# Patient Record
Sex: Male | Born: 1995 | Hispanic: Yes | Marital: Single | Smoking: Never smoker
Health system: Southern US, Community
[De-identification: ages and names within clinical notes are randomized; demographics above are authoritative.]

## PROBLEM LIST (undated history)

## (undated) DIAGNOSIS — D869 Sarcoidosis, unspecified: Secondary | ICD-10-CM

## (undated) DIAGNOSIS — J309 Allergic rhinitis, unspecified: Secondary | ICD-10-CM

## (undated) DIAGNOSIS — E669 Obesity, unspecified: Secondary | ICD-10-CM

## (undated) DIAGNOSIS — H44119 Panuveitis, unspecified eye: Secondary | ICD-10-CM

## (undated) DIAGNOSIS — J45909 Unspecified asthma, uncomplicated: Secondary | ICD-10-CM

## (undated) HISTORY — PX: ADENOIDECTOMY: SUR15

## (undated) HISTORY — DX: Allergic rhinitis, unspecified: J30.9

## (undated) HISTORY — PX: TONSILLECTOMY: SUR1361

## (undated) HISTORY — DX: Obesity, unspecified: E66.9

---
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
## 2019-05-18 ENCOUNTER — Encounter: Payer: Self-pay | Admitting: *Deleted

## 2019-05-18 ENCOUNTER — Other Ambulatory Visit: Payer: Self-pay

## 2019-05-18 ENCOUNTER — Emergency Department: Payer: 59

## 2019-05-18 ENCOUNTER — Emergency Department
Admission: EM | Admit: 2019-05-18 | Discharge: 2019-05-19 | Disposition: A | Discharge status: Home / Self Care | Payer: 59 | Attending: Emergency Medicine | Admitting: Emergency Medicine

## 2019-05-18 DIAGNOSIS — U071 COVID-19: Secondary | ICD-10-CM | POA: Diagnosis present

## 2019-05-18 DIAGNOSIS — Z79899 Other long term (current) drug therapy: Secondary | ICD-10-CM | POA: Insufficient documentation

## 2019-05-18 DIAGNOSIS — E860 Dehydration: Secondary | ICD-10-CM

## 2019-05-18 DIAGNOSIS — R0602 Shortness of breath: Secondary | ICD-10-CM

## 2019-05-18 DIAGNOSIS — Z7722 Contact with and (suspected) exposure to environmental tobacco smoke (acute) (chronic): Secondary | ICD-10-CM | POA: Insufficient documentation

## 2019-05-18 DIAGNOSIS — J1282 Pneumonia due to coronavirus disease 2019: Secondary | ICD-10-CM | POA: Diagnosis not present

## 2019-05-18 DIAGNOSIS — J45909 Unspecified asthma, uncomplicated: Secondary | ICD-10-CM | POA: Diagnosis not present

## 2019-05-18 LAB — BASIC METABOLIC PANEL
Anion gap: 12 (ref 5–15)
BUN: 15 mg/dL (ref 6–20)
CO2: 21 mmol/L — ABNORMAL LOW (ref 22–32)
Calcium: 8.8 mg/dL — ABNORMAL LOW (ref 8.9–10.3)
Chloride: 104 mmol/L (ref 98–111)
Creatinine, Ser: 0.99 mg/dL (ref 0.61–1.24)
GFR calc Af Amer: >60 mL/min (ref >60)
GFR calc non Af Amer: >60 mL/min (ref >60)
Glucose, Bld: 127 mg/dL — ABNORMAL HIGH (ref 70–99)
Potassium: 3.5 mmol/L (ref 3.5–5.1)
Sodium: 137 mmol/L (ref 135–145)

## 2019-05-18 LAB — CBC
HCT: 48.3 % (ref 39.0–52.0)
Hemoglobin: 16.4 g/dL (ref 13.0–17.0)
MCH: 28.6 pg (ref 26.0–34.0)
MCHC: 34 g/dL (ref 30.0–36.0)
MCV: 84.1 fL (ref 80.0–100.0)
Platelets: 161 10*3/uL (ref 150–400)
RBC: 5.74 MIL/uL (ref 4.22–5.81)
RDW: 12.4 % (ref 11.5–15.5)
WBC: 5.8 10*3/uL (ref 4.0–10.5)
nRBC: 0 % (ref 0.0–0.2)

## 2019-05-18 LAB — TROPONIN I (HIGH SENSITIVITY): Troponin I (High Sensitivity): 3 ng/L (ref <18)

## 2019-05-18 MED ORDER — ACETAMINOPHEN 500 MG PO TABS
1000.0000 mg | ORAL_TABLET | Freq: Once | ORAL | Status: AC
  Administered 2019-05-19: 1000 mg via ORAL
  Filled 2019-05-18: qty 2

## 2019-05-18 MED ORDER — SODIUM CHLORIDE 0.9 % IV BOLUS
1000.0000 mL | Freq: Once | INTRAVENOUS | Status: AC
  Administered 2019-05-19: 1000 mL via INTRAVENOUS

## 2019-05-18 NOTE — ED Triage Notes (Signed)
Pt reports fatigue and SOB x 3 days. Pt tested positive for COVID today. Last dose of tylenol was 20:00 this evening and pt is afebrile upon arrival. Pt is diaphoretic but alert and able to speak in complete sentences and ambulate without increased WOB noted.  No chest pain at this time. Pt reports decreased PO intake and hydration due to increased amount of sleeping.

## 2019-05-19 ENCOUNTER — Encounter: Payer: Self-pay | Admitting: Radiology

## 2019-05-19 ENCOUNTER — Emergency Department: Payer: 59

## 2019-05-19 LAB — PROCALCITONIN: Procalcitonin: <0.1 ng/mL

## 2019-05-19 LAB — TROPONIN I (HIGH SENSITIVITY): Troponin I (High Sensitivity): 2 ng/L (ref <18)

## 2019-05-19 MED ORDER — DEXAMETHASONE SODIUM PHOSPHATE 10 MG/ML IJ SOLN
6.0000 mg | Freq: Once | INTRAMUSCULAR | Status: AC
  Administered 2019-05-19: 6 mg via INTRAVENOUS
  Filled 2019-05-19: qty 1

## 2019-05-19 MED ORDER — IOHEXOL 350 MG/ML SOLN
100.0000 mL | Freq: Once | INTRAVENOUS | Status: AC | PRN
  Administered 2019-05-19: 01:00:00 100 mL via INTRAVENOUS

## 2019-05-19 MED ORDER — SODIUM CHLORIDE 0.9 % IV BOLUS
1000.0000 mL | Freq: Once | INTRAVENOUS | Status: AC
  Administered 2019-05-19: 02:00:00 1000 mL via INTRAVENOUS

## 2019-05-19 MED ORDER — ONDANSETRON 4 MG PO TBDP: 4.0000 mg | ORAL_TABLET | Freq: Three times a day (TID) | ORAL | 0 refills | Status: DC | PRN

## 2019-05-19 MED ORDER — DEXAMETHASONE 6 MG PO TABS: 6.0000 mg | ORAL_TABLET | Freq: Two times a day (BID) | ORAL | 0 refills | Status: DC

## 2019-05-19 NOTE — ED Notes (Signed)
Pt to CT via stretcher accomp by CT tech 

## 2019-05-19 NOTE — ED Notes (Signed)
Pt returns to room from CT 

## 2019-05-19 NOTE — ED Notes (Signed)
Pt ambulatory without difficulty; pulse ox on ra maintains 95-100% with ambulation and pt voices no c/o with effort

## 2019-05-19 NOTE — Discharge Instructions (Addendum)

## 2019-05-19 NOTE — ED Notes (Signed)
Pt uprite on stretcher in exam room with no distress noted; reports recent fever & cough with SHOB; st +COVID at [facility] on Wednesday; pt assisted into hosp gown & on card monitor

## 2019-05-19 NOTE — ED Provider Notes (Signed)
[facility] Emergency Department Provider Note  ____________________________________________  Time seen: Approximately 1:22 AM  I have reviewed the triage vital signs and the nursing notes.   HISTORY  Chief Complaint Tachycardia and COVID   HPI Roy Rowe is a 24 y.o. male with a history of asthma and obesity who presents for evaluation of tachycardia.  [facility] after having 3 days of fever as high as 101F, cough productive of yellow phlegm, generalized body aches, and fatigue.  He was diagnosed with Covid and found to be tachycardic with a pulse in the 140s.  Therefore he was sent to the emergency room for evaluation.  Patient reports intermittent episodes of sharp chest pain when he coughs.  Also complaining of mild shortness of breath mostly when he sits up.  He denies any chest pain or SOB at this time.  Has had nausea but no vomiting or diarrhea.   Has had decreased oral hydration.  Has been sleeping a lot.  He denies any personal or family history of blood clots, recent travel immobilization, leg pain or swelling, hemoptysis, or exogenous hormones.  Past Medical History:  Diagnosis Date  . Allergic rhinitis   . Asthma   . Obesity (BMI 30-39.9)     Patient Active Problem List   Diagnosis Date Noted  . Blepharitis of left eye 03/24/2013  . Hordeolum externum 03/22/2013  . Obesity, unspecified 02/01/2013  . Allergic rhinitis 02/01/2013    Past Surgical History:  Procedure Laterality Date  . ADENOIDECTOMY    . TONSILLECTOMY      Prior to Admission medications   Medication Sig Start Date End Date Taking? Authorizing Provider  amoxicillin (AMOXIL) 500 MG capsule Take 1 capsule (500 mg total) by mouth 2 (two) times daily. 02/08/13   Lura Em, MD  cetirizine (ZYRTEC) 10 MG tablet Take 1 tablet (10 mg total) by mouth daily. 02/01/13   Lura Em, MD  clindamycin (CLEOCIN) 300 MG capsule Take 2 capsules (600 mg total)  by mouth 3 (three) times daily. 03/22/13   Tesfaye, Mekdem, MD  dexamethasone (DECADRON) 6 MG tablet Take 1 tablet (6 mg total) by mouth 2 (two) times daily with a meal for 5 days. 05/19/19 05/24/19  Nita Sickle, MD  HYDROcodone-acetaminophen (NORCO/VICODIN) 5-325 MG tablet Take one tab po q 4-6 hrs prn pain 10/24/15   Triplett, Tammy, PA-C  ibuprofen (ADVIL,MOTRIN) 600 MG tablet Take 1 tablet (600 mg total) by mouth every 6 (six) hours as needed. Take with food 10/24/15   Triplett, Tammy, PA-C  ondansetron (ZOFRAN ODT) 4 MG disintegrating tablet Take 1 tablet (4 mg total) by mouth every 8 (eight) hours as needed. 05/19/19   Nita Sickle, MD    Allergies Patient has no known allergies.  Family History  Problem Relation Age of Onset  . Allergic rhinitis Brother   . Asthma Brother     Social History Social History   Tobacco Use  . Smoking status: Passive Smoke Exposure - Never Smoker  . Smokeless tobacco: Never Used  Substance Use Topics  . Alcohol use: No  . Drug use: No    Review of Systems  Constitutional: + fever, body aches, fatigue Eyes: Negative for visual changes. ENT: Negative for sore throat. Neck: No neck pain  Cardiovascular: + intermittent chest pain. Respiratory: + shortness of breath, cough Gastrointestinal: Negative for abdominal pain, vomiting or diarrhea. + nausea Genitourinary: Negative for dysuria. Musculoskeletal: Negative for back pain. Skin: Negative for  rash. Neurological: Negative for headaches, weakness or numbness. Psych: No SI or HI  ____________________________________________   PHYSICAL EXAM:  VITAL SIGNS: ED Triage Vitals [05/18/19 2200]  Enc Vitals Group     BP (!) 149/107     Pulse Rate (!) 124     Resp 20     Temp 99.7 F (37.6 C)     Temp Source Oral     SpO2 95 %     Weight 229 lb 15 oz (104.3 kg)     Height      Head Circumference      Peak Flow      Pain Score 5     Pain Loc      Pain Edu?      Excl. in [place]?      Constitutional: Alert and oriented. Well appearing and in no apparent distress. HEENT:      Head: Normocephalic and atraumatic.         Eyes: Conjunctivae are normal. Sclera is non-icteric.       Mouth/Throat: Mucous membranes are dry.       Neck: Supple with no signs of meningismus. Cardiovascular: Tachycardic with regular rhythm. No murmurs, gallops, or rubs. 2+ symmetrical distal pulses are present in all extremities. No JVD. Respiratory: Normal respiratory effort. Lungs are clear to auscultation bilaterally. No wheezes, crackles, or rhonchi.  Gastrointestinal: Soft, non tender, and non distended with positive bowel sounds. No rebound or guarding. Musculoskeletal: Nontender with normal range of motion in all extremities. No edema, cyanosis, or erythema of extremities. Neurologic: Normal speech and language. Face is symmetric. Moving all extremities. No gross focal neurologic deficits are appreciated. Skin: Skin is warm, dry and intact. No rash noted. Psychiatric: Mood and affect are normal. Speech and behavior are normal.  ____________________________________________   LABS (all labs ordered are listed, but only abnormal results are displayed)  Labs Reviewed  BASIC METABOLIC PANEL - Abnormal; Notable for the following components:      Result Value   CO2 21 (*)    Glucose, Bld 127 (*)    Calcium 8.8 (*)    All other components within normal limits  CBC  PROCALCITONIN  TROPONIN I (HIGH SENSITIVITY)  TROPONIN I (HIGH SENSITIVITY)   ____________________________________________  EKG  ED ECG REPORT I, Rudene Re, the attending physician, personally viewed and interpreted this ECG.  Sinus tachycardia, rate of 125, normal intervals, right axis deviation, no ST elevations or depressions.  No prior for comparison. ____________________________________________  RADIOLOGY  I have personally reviewed the images performed during this visit and I agree with the Radiologist's  read.   Interpretation by Radiologist:  DG Chest 1 View  Result Date: 05/19/2019 CLINICAL DATA:  24 year old male with shortness of breath and fatigue. EXAM: CHEST  1 VIEW COMPARISON:  Chest radiograph dated 09/22/2007 FINDINGS: There is borderline cardiomegaly. Left upper lobe and left lung base streaky densities may represent vascular congestion but concerning for developing infiltrate. Clinical correlation is recommended. There is no pleural effusion or pneumothorax. No acute osseous pathology. IMPRESSION: Vascular congestion versus developing infiltrate in the left lung. Clinical correlation is recommended. Electronically Signed   By: Anner Crete M.D.   On: 05/19/2019 00:20   CT Angio Chest PE W and/or Wo Contrast  Result Date: 05/19/2019 CLINICAL DATA:  24 year old male with shortness of breath x3 days. Concern for pulmonary embolism. EXAM: CT ANGIOGRAPHY CHEST WITH CONTRAST TECHNIQUE: Multidetector CT imaging of the chest was performed using the standard protocol during  bolus administration of intravenous contrast. Multiplanar CT image reconstructions and MIPs were obtained to evaluate the vascular anatomy. CONTRAST:  OMNIPAQUE IOHEXOL 350 MG/ML SOLN COMPARISON:  Chest radiograph dated 05/19/2019. FINDINGS: Cardiovascular: There is no cardiomegaly or pericardial effusion. The thoracic aorta is unremarkable. Evaluation of the pulmonary arteries is very limited due to respiratory motion artifact and suboptimal opacification and timing of the contrast. No large central pulmonary artery embolus identified. Mediastinum/Nodes: No hilar or mediastinal adenopathy. The esophagus and thyroid gland are grossly unremarkable as visualized. No mediastinal fluid collection. Lungs/Pleura: Multiple bilateral confluent and nodular airspace opacities with the largest measuring approximately 3.5 cm in the left upper lobe most consistent with multifocal pneumonia. Septic emboli are less likely but not excluded.  Clinical correlation is recommended. There is no pleural effusion or pneumothorax. The central airways are patent. Upper Abdomen: Fatty infiltration of the liver. Musculoskeletal: Mild dextroscoliosis of the thoracic spine. No acute osseous pathology. Review of the MIP images confirms the above findings. IMPRESSION: 1. No CT evidence of central pulmonary artery embolus. 2. Bilateral confluent and nodular airspace opacities most consistent with multifocal pneumonia. Septic emboli are less likely but not excluded. Clinical correlation is recommended. Electronically Signed   By: Elgie Collard M.D.   On: 05/19/2019 01:07     ____________________________________________   PROCEDURES  Procedure(s) performed: None Procedures Critical Care performed:  None ____________________________________________   INITIAL IMPRESSION / ASSESSMENT AND PLAN / ED COURSE   24 y.o. male with a history of asthma and obesity who presents for evaluation of [facility]cardia from [facility] after being diagnosed with COVID today.  Patient looks dry on exam, tachycardic with a pulse of 124, afebrile, normal work of breathing, normal sats both at rest and with ambulation, lungs are clear to auscultation with great air movement, no wheezing or crackles.  Labs with no electrolyte derangements, AKI, significant dehydration.  No leukocytosis, no anemia.  EKG showing sinus tachycardia with no ischemic changes.  Troponin x2 - showing no cardiac involvement.  CT of the chest was done due to significant tachycardia, chest pain and shortness of breath to rule out a PE.  CT shows findings consistent with Covid but no signs of PE.  Since patient has a history of asthma he was given IV Decadron.  He is not having an asthma exacerbation at this time. Will give IVF and reassess.   Clinical Course as of May 18 249  Thu May 19, 2019  2956 Patient remains extremely well-appearing with normal work of breathing.  I ambulated him in the room and his  sats were never below 98%.  His heart rate has improved significantly with fluids.  Will discharge home with Decadron, Zofran, increase p.o. hydration.  Recommended pulse oximeter checks at home, discussed follow-up with PCP and my standard return precautions.   [CV]    Clinical Course User Index [CV] Don Perking [place], MD      As part of my medical decision making, I reviewed the following data within the electronic MEDICAL RECORD NUMBER Nursing notes reviewed and incorporated, Labs reviewed , EKG interpreted , Old EKG reviewed, Old chart reviewed, Radiograph reviewed , Notes from prior ED visits and [place] Controlled Substance Database   Please note:  Patient was evaluated in Emergency Department today for the symptoms described in the history of present illness. Patient was evaluated in the context of the global COVID-19 pandemic, which necessitated consideration that the patient might be at risk for infection with the SARS-CoV-2 virus that causes  COVID-19. Institutional protocols and algorithms that pertain to the evaluation of patients at risk for COVID-19 are in a state of rapid change based on information released by regulatory bodies including the CDC and federal and state organizations. These policies and algorithms were followed during the patient's care in the ED.  Some ED evaluations and interventions may be delayed as a result of limited staffing during the pandemic.   ____________________________________________   FINAL CLINICAL IMPRESSION(S) / ED DIAGNOSES   Final diagnoses:  Pneumonia due to COVID-19 virus  Dehydration      NEW MEDICATIONS STARTED DURING THIS VISIT:  ED Discharge Orders         Ordered    dexamethasone (DECADRON) 6 MG tablet  2 times daily with meals     05/19/19 0134    ondansetron (ZOFRAN ODT) 4 MG disintegrating tablet  Every 8 hours PRN     05/19/19 0134           Note:  This document was prepared using Dragon voice recognition software and may  include unintentional dictation errors.    Don Perking, [place], MD 05/19/19 [phone]

## 2019-05-23 ENCOUNTER — Emergency Department: Payer: 59

## 2019-05-23 ENCOUNTER — Encounter: Payer: Self-pay | Admitting: Emergency Medicine

## 2019-05-23 ENCOUNTER — Inpatient Hospital Stay
Admission: EM | Admit: 2019-05-23 | Discharge: 2019-05-27 | DRG: 177 | Disposition: A | Discharge status: Home / Self Care | Payer: 59 | Attending: Internal Medicine | Admitting: Internal Medicine

## 2019-05-23 ENCOUNTER — Other Ambulatory Visit: Payer: Self-pay

## 2019-05-23 DIAGNOSIS — Z23 Encounter for immunization: Secondary | ICD-10-CM | POA: Diagnosis not present

## 2019-05-23 DIAGNOSIS — R030 Elevated blood-pressure reading, without diagnosis of hypertension: Secondary | ICD-10-CM | POA: Diagnosis present

## 2019-05-23 DIAGNOSIS — J45909 Unspecified asthma, uncomplicated: Secondary | ICD-10-CM | POA: Diagnosis present

## 2019-05-23 DIAGNOSIS — Z6841 Body Mass Index (BMI) 40.0 and over, adult: Secondary | ICD-10-CM

## 2019-05-23 DIAGNOSIS — J9601 Acute respiratory failure with hypoxia: Secondary | ICD-10-CM | POA: Diagnosis present

## 2019-05-23 DIAGNOSIS — K760 Fatty (change of) liver, not elsewhere classified: Secondary | ICD-10-CM | POA: Diagnosis present

## 2019-05-23 DIAGNOSIS — U071 COVID-19: Principal | ICD-10-CM | POA: Diagnosis present

## 2019-05-23 DIAGNOSIS — J1282 Pneumonia due to coronavirus disease 2019: Secondary | ICD-10-CM | POA: Diagnosis not present

## 2019-05-23 DIAGNOSIS — R0902 Hypoxemia: Secondary | ICD-10-CM | POA: Diagnosis present

## 2019-05-23 DIAGNOSIS — R7989 Other specified abnormal findings of blood chemistry: Secondary | ICD-10-CM

## 2019-05-23 LAB — COMPREHENSIVE METABOLIC PANEL
ALT: 260 U/L — ABNORMAL HIGH (ref 0–44)
AST: 194 U/L — ABNORMAL HIGH (ref 15–41)
Albumin: 3.7 g/dL (ref 3.5–5.0)
Alkaline Phosphatase: 65 U/L (ref 38–126)
Anion gap: 14 (ref 5–15)
BUN: 15 mg/dL (ref 6–20)
CO2: 21 mmol/L — ABNORMAL LOW (ref 22–32)
Calcium: 8.4 mg/dL — ABNORMAL LOW (ref 8.9–10.3)
Chloride: 97 mmol/L — ABNORMAL LOW (ref 98–111)
Creatinine, Ser: 0.91 mg/dL (ref 0.61–1.24)
GFR calc Af Amer: >60 mL/min (ref >60)
GFR calc non Af Amer: >60 mL/min (ref >60)
Glucose, Bld: 135 mg/dL — ABNORMAL HIGH (ref 70–99)
Potassium: 3.2 mmol/L — ABNORMAL LOW (ref 3.5–5.1)
Sodium: 132 mmol/L — ABNORMAL LOW (ref 135–145)
Total Bilirubin: 2.1 mg/dL — ABNORMAL HIGH (ref 0.3–1.2)
Total Protein: 8.3 g/dL — ABNORMAL HIGH (ref 6.5–8.1)

## 2019-05-23 LAB — LACTIC ACID, PLASMA: Lactic Acid, Venous: 1.3 mmol/L (ref 0.5–1.9)

## 2019-05-23 LAB — CBC WITH DIFFERENTIAL/PLATELET
Abs Immature Granulocytes: 0.06 10*3/uL (ref 0.00–0.07)
Basophils Absolute: 0 10*3/uL (ref 0.0–0.1)
Basophils Relative: 0 %
Eosinophils Absolute: 0 10*3/uL (ref 0.0–0.5)
Eosinophils Relative: 0 %
HCT: 46.4 % (ref 39.0–52.0)
Hemoglobin: 16.2 g/dL (ref 13.0–17.0)
Immature Granulocytes: 1 %
Lymphocytes Relative: 11 %
Lymphs Abs: 1.2 10*3/uL (ref 0.7–4.0)
MCH: 28.8 pg (ref 26.0–34.0)
MCHC: 34.9 g/dL (ref 30.0–36.0)
MCV: 82.4 fL (ref 80.0–100.0)
Monocytes Absolute: 0.5 10*3/uL (ref 0.1–1.0)
Monocytes Relative: 4 %
Neutro Abs: 8.9 10*3/uL — ABNORMAL HIGH (ref 1.7–7.7)
Neutrophils Relative %: 84 %
Platelets: 243 10*3/uL (ref 150–400)
RBC: 5.63 MIL/uL (ref 4.22–5.81)
RDW: 12 % (ref 11.5–15.5)
WBC: 10.7 10*3/uL — ABNORMAL HIGH (ref 4.0–10.5)
nRBC: 0 % (ref 0.0–0.2)

## 2019-05-23 LAB — LACTATE DEHYDROGENASE: LDH: 428 U/L — ABNORMAL HIGH (ref 98–192)

## 2019-05-23 LAB — FIBRIN DERIVATIVES D-DIMER (ARMC ONLY): Fibrin derivatives D-dimer (ARMC): 520.42 ng/mL (FEU) — ABNORMAL HIGH (ref 0.00–499.00)

## 2019-05-23 LAB — PROCALCITONIN: Procalcitonin: <0.1 ng/mL

## 2019-05-23 LAB — POC SARS CORONAVIRUS 2 AG: SARS Coronavirus 2 Ag: POSITIVE — AB

## 2019-05-23 LAB — HIV ANTIBODY (ROUTINE TESTING W REFLEX): HIV Screen 4th Generation wRfx: NONREACTIVE

## 2019-05-23 LAB — FERRITIN: Ferritin: 801 ng/mL — ABNORMAL HIGH (ref 24–336)

## 2019-05-23 LAB — C-REACTIVE PROTEIN: CRP: 8.9 mg/dL — ABNORMAL HIGH (ref <1.0)

## 2019-05-23 LAB — TROPONIN I (HIGH SENSITIVITY): Troponin I (High Sensitivity): 3 ng/L (ref <18)

## 2019-05-23 MED ORDER — SODIUM CHLORIDE 0.9 % IV SOLN
200.0000 mg | Freq: Once | INTRAVENOUS | Status: AC
  Administered 2019-05-23: 200 mg via INTRAVENOUS
  Filled 2019-05-23: qty 200

## 2019-05-23 MED ORDER — ONDANSETRON HCL 4 MG/2ML IJ SOLN: 4.0000 mg | Freq: Four times a day (QID) | INTRAMUSCULAR | Status: DC | PRN

## 2019-05-23 MED ORDER — SODIUM CHLORIDE 0.9 % IV BOLUS
500.0000 mL | Freq: Once | INTRAVENOUS | Status: AC
  Administered 2019-05-23: 500 mL via INTRAVENOUS

## 2019-05-23 MED ORDER — ACETAMINOPHEN 325 MG PO TABS
650.0000 mg | ORAL_TABLET | Freq: Four times a day (QID) | ORAL | Status: DC | PRN
  Administered 2019-05-23: 650 mg via ORAL
  Filled 2019-05-23: qty 2

## 2019-05-23 MED ORDER — ENOXAPARIN SODIUM 40 MG/0.4ML ~~LOC~~ SOLN
40.0000 mg | Freq: Two times a day (BID) | SUBCUTANEOUS | Status: DC
  Administered 2019-05-23 – 2019-05-27 (×8): 40 mg via SUBCUTANEOUS
  Filled 2019-05-23 (×8): qty 0.4

## 2019-05-23 MED ORDER — DEXAMETHASONE SODIUM PHOSPHATE 10 MG/ML IJ SOLN
6.0000 mg | INTRAMUSCULAR | Status: DC
  Administered 2019-05-24 – 2019-05-27 (×4): 6 mg via INTRAVENOUS
  Filled 2019-05-23 (×5): qty 1

## 2019-05-23 MED ORDER — SODIUM CHLORIDE 0.9 % IV SOLN
100.0000 mg | Freq: Every day | INTRAVENOUS | Status: AC
  Administered 2019-05-24 – 2019-05-27 (×4): 100 mg via INTRAVENOUS
  Filled 2019-05-23 (×5): qty 20

## 2019-05-23 MED ORDER — ONDANSETRON HCL 4 MG PO TABS: 4.0000 mg | ORAL_TABLET | Freq: Four times a day (QID) | ORAL | Status: DC | PRN

## 2019-05-23 MED ORDER — DEXAMETHASONE SODIUM PHOSPHATE 10 MG/ML IJ SOLN
10.0000 mg | Freq: Once | INTRAMUSCULAR | Status: AC
  Administered 2019-05-23: 10 mg via INTRAVENOUS
  Filled 2019-05-23: qty 1

## 2019-05-23 NOTE — ED Notes (Signed)
Plugged up pt's cell phone for him and adjusted bed.

## 2019-05-23 NOTE — H&P (Addendum)
History and Physical:    Roy Rowe   GNF:[id number] DOB: [birth year] DOA: 05/23/2019  Referring MD/provider: Dr. Daryel November PCP: Patient, [name]   Patient coming from: Home  Chief Complaint: Shortness of breath  History of Present Illness:   Roy Rowe is an 24 y.o. male with medical history significant for morbid obesity, asthma, allergic rhinitis, presented to the hospital because of shortness of breath.  He said he [facility], [facility].  He has been feeling weak, tired and feverish at home.  His temperature has been above 101 at times.  Shortness of breath is usually worse with exertion.  He says shortness of breath worsened today (in the early hours of the morning) so he decided to come to the emergency room for further evaluation.  He has a cough but  no vomiting, diarrhea, abdominal pain.  ED Course:  The patient was hypertensive, tachypneic, tachycardic and hypoxemic in the emergency room but he was afebrile.  Oxygen saturation was 89% on room air. He was placed on 2 L of oxygen via nasal cannula.  Ferritin was 801, LDH 428 and CRP 8.9.  Chest x-ray showed bilateral opacities.  He was given IV dexamethasone in the emergency room.  ROS:   ROS all other systems reviewed were negative  Past Medical History:   Past Medical History:  Diagnosis Date  . Allergic rhinitis   . Asthma   . Obesity (BMI 30-39.9)     Past Surgical History:   Past Surgical History:  Procedure Laterality Date  . ADENOIDECTOMY    . TONSILLECTOMY      Social History:   Social History   Socioeconomic History  . Marital status: Single    Spouse name: Not on file  . Number of children: Not on file  . Years of education: Not on file  . Highest education level: Not on file  Occupational History  . Not on file  Tobacco Use  . Smoking status: Passive Smoke Exposure - Never Smoker  . Smokeless tobacco: Never Used  Substance  and Sexual Activity  . Alcohol use: No  . Drug use: No  . Sexual activity: Never  Other Topics Concern  . Not on file  Social History Narrative  . Not on file   Social Determinants of Health   Financial Resource Strain:   . Difficulty of Paying Living Expenses: Not on file  Food Insecurity:   . Worried About Programme researcher, broadcasting/film/video in the Last Year: Not on file  . Ran Out of Food in the Last Year: Not on file  Transportation Needs:   . Lack of Transportation (Medical): Not on file  . Lack of Transportation (Non-Medical): Not on file  Physical Activity:   . Days of Exercise per Week: Not on file  . Minutes of Exercise per Session: Not on file  Stress:   . Feeling of Stress : Not on file  Social Connections:   . Frequency of Communication with Friends and Family: Not on file  . Frequency of Social Gatherings with Friends and Family: Not on file  . Attends Religious Services: Not on file  . Active Member of Clubs or Organizations: Not on file  . Attends Banker Meetings: Not on file  . Marital Status: Not on file  Intimate Partner Violence:   . Fear of Current or Ex-Partner: Not on file  . Emotionally Abused: Not  on file  . Physically Abused: Not on file  . Sexually Abused: Not on file    Allergies   Patient has no known allergies.  Family history:   Family History  Problem Relation Age of Onset  . Allergic rhinitis Brother   . Asthma Brother     Current Medications:   Prior to Admission medications   Medication Sig Start Date End Date Taking? Authorizing Provider  dexamethasone (DECADRON) 6 MG tablet Take 1 tablet (6 mg total) by mouth 2 (two) times daily with a meal for 5 days. 05/19/19 05/24/19  Rudene Re, MD  ondansetron (ZOFRAN ODT) 4 MG disintegrating tablet Take 1 tablet (4 mg total) by mouth every 8 (eight) hours as needed. 05/19/19   Rudene Re, MD    Physical Exam:   Vitals:   05/23/19 1130 05/23/19 1200 05/23/19 1300 05/23/19  1330  BP: (!) 167/96 (!) 173/94 (!) 153/72 (!) 169/91  Pulse: 99 (!) 110 (!) 103 (!) 103  Resp: (!) 32 (!) 30 (!) 26 (!) 28  Temp:      TempSrc:      SpO2: 96% 94% 94% 94%  Weight:      Height:         Physical Exam: Blood pressure (!) 169/91, pulse (!) 103, temperature 98.8 F (37.1 C), temperature source Oral, resp. rate (!) 28, height 6\' 2"  (1.88 m), weight (!) 167.8 kg, SpO2 94 %. Gen: No acute distress. Head: Normocephalic, atraumatic. Eyes: Pupils equal, round and reactive to light. Extraocular movements intact.  Sclerae nonicteric.  Mouth: Moist mucous membranes Neck: Supple, no thyromegaly, no lymphadenopathy, no jugular venous distention. Chest: Lungs are clear to auscultation with good air movement. No rales, rhonchi or wheezes.  CV: Heart sounds are regular with an S1, S2. No murmurs, rubs or gallops.  Abdomen: Soft, nontender, obese with normal active bowel sounds. No palpable masses. Extremities: Extremities are without clubbing, or cyanosis. No edema. Pedal pulses 2+.  Skin: Warm and dry. No rash Neuro: Alert and oriented times 3; grossly nonfocal.  Psych: Insight is good and judgment is appropriate. Mood and affect normal.   Data Review:    Labs: Basic Metabolic Panel: Recent Labs  Lab 05/18/19 2205 05/23/19 0730  NA 137 132*  K 3.5 3.2*  CL 104 97*  CO2 21* 21*  GLUCOSE 127* 135*  BUN 15 15  CREATININE 0.99 0.91  CALCIUM 8.8* 8.4*   Liver Function Tests: Recent Labs  Lab 05/23/19 0730  AST 194*  ALT 260*  ALKPHOS 65  BILITOT 2.1*  PROT 8.3*  ALBUMIN 3.7   No results for input(s): LIPASE, AMYLASE in the last 168 hours. No results for input(s): AMMONIA in the last 168 hours. CBC: Recent Labs  Lab 05/18/19 2205 05/23/19 0730  WBC 5.8 10.7*  NEUTROABS  --  8.9*  HGB 16.4 16.2  HCT 48.3 46.4  MCV 84.1 82.4  PLT 161 243   Cardiac Enzymes: No results for input(s): CKTOTAL, CKMB, CKMBINDEX, TROPONINI in the last 168 hours.  BNP (last  3 results) No results for input(s): PROBNP in the last 8760 hours. CBG: No results for input(s): GLUCAP in the last 168 hours.  Urinalysis No results found for: COLORURINE, APPEARANCEUR, LABSPEC, PHURINE, GLUCOSEU, HGBUR, BILIRUBINUR, KETONESUR, PROTEINUR, UROBILINOGEN, NITRITE, LEUKOCYTESUR    Radiographic Studies: DG Chest Port 1 View  Result Date: 05/23/2019 CLINICAL DATA:  Shortness of breath.  COVID-19 positive EXAM: PORTABLE CHEST 1 VIEW COMPARISON:  May 18, 2019 chest radiograph  and chest CT May 19, 2019 FINDINGS: There is airspace opacity in both mid and lower lung zones with an overall slight increase in opacity compared to recent study. No frank consolidation. Heart is upper normal in size with pulmonary vascularity normal. No adenopathy. No bone lesions. IMPRESSION: Patchy airspace opacity bilaterally, with an overall slight increase in opacity. The area of somewhat irregular opacity in the left upper lobe seen on recent CT is not appreciable by radiography; there may be focal clearing in this area of the left upper lobe compared to previous study. Stable cardiac silhouette. No adenopathy. The overall appearance is consistent with atypical organism pneumonia. Electronically Signed   By: Bretta Bang III M.D.   On: 05/23/2019 07:59    EKG: Independently reviewed.  Sinus tachycardia   Assessment/Plan:   Principal Problem:   Pneumonia due to COVID-19 virus Active Problems:   Morbid obesity (HCC)   Acute hypoxemic respiratory failure (HCC)   Body mass index is 47.51 kg/m.  (Morbid obesity)  COVID-19 pneumonia: Admit to MedSurg.  Treat with IV dexamethasone and remdesivir.  I informed his nurse, Tinnie Gens, to obtain coronavirus test result from [facility].  Acute hypoxemic respiratory failure: He is on 2 L/min oxygen via nasal cannula.  Taper off as able.  Elevated blood pressure: Not a known hypertensive.  Monitor BP.  Asthma: Compensated.  Other  information:   DVT prophylaxis: Lovenox Code Status: Full code. Family Communication: Plan discussed with the patient Disposition Plan: Possible discharge to home in 2 to 3 days depending on clinical condition Consults called: None Admission status: Inpatient   The medical decision making is of moderate complexity, therefore this is a level 2 visit.  Time spent 45 minutes  Geovani Tootle [facility]   How to contact the [facility] Attending or Consulting provider 7A - 7P or covering provider during after hours 7P -7A, for this patient?   1. Check the care team in [facility] and look for a) attending/consulting [facility] provider listed and b) the [facility] team listed 2. Log into [email or web address] and use [place]'s universal password to access. If you do not have the password, please contact the hospital operator. 3. Locate the [facility] provider you are looking for under [facility] and page to a number that you can be directly reached. 4. If you still have difficulty reaching the provider, please page the [facility] (Director on Call) for the Hospitalists listed on amion for assistance.  05/23/2019, 2:29 PM

## 2019-05-23 NOTE — ED Notes (Signed)
Spoke w/ pt's mother Gilman Buttner [phone] and provided an update on her son.

## 2019-05-23 NOTE — Progress Notes (Signed)
PHARMACIST - PHYSICIAN COMMUNICATION  CONCERNING:  Enoxaparin (Lovenox) for DVT Prophylaxis    RECOMMENDATION: Patient was prescribed enoxaprin 40mg  q24 hours for VTE prophylaxis.   Filed Weights   05/23/19 0722  Weight: (!) 370 lb (167.8 kg)    Body mass index is 47.51 kg/m.  Estimated Creatinine Clearance: 211.4 mL/min (by C-G formula based on SCr of 0.91 mg/dL).   Based on [facility] policy patient is candidate for enoxaparin 40mg  every 12 hour dosing due to BMI being >40.   DESCRIPTION: Pharmacy has adjusted enoxaparin dose per [facility] policy.  Patient is now receiving enoxaparin 40mg  every 12 hours.    Timithy Arons, PharmD Clinical Pharmacist  05/23/2019 12:01 PM

## 2019-05-23 NOTE — ED Notes (Signed)
Replaced ekg leads on pt to get him back on the monitor, provided pt w/ meal tray.

## 2019-05-23 NOTE — ED Notes (Signed)
Provided pt with tv remote.

## 2019-05-23 NOTE — ED Notes (Signed)
Assited pt to use the toilet.

## 2019-05-23 NOTE — ED Notes (Signed)
. ED TO INPATIENT HANDOFF REPORT  ED Nurse Name and Phone #: Danae Orleans Name/Age/Gender Roy Rowe 24 y.o. male Room/Bed: ED32A/ED32A  Code Status   Code Status: Full Code  Home/SNF/Other Home Patient oriented to: self, place, time and situation Is this baseline? Yes   Triage Complete: Triage complete  Chief Complaint Pneumonia due to COVID-19 virus [U07.1, J12.82]  Triage Note Pt in via [facility], pt is Covid+, reports acute SOB waking him from his sleep. Per EMS, pt hypoxic, 89% on room air.   Room air sat 91% upon arrival.  Increased work of breathing noted with any exertion.  Pt placed on 2L nasal cannula.  EDP, Williams at bedside.    Allergies No Known Allergies  Level of Care/Admitting Diagnosis ED Disposition    ED Disposition Condition Comment   Admit  Hospital Area: [facility] [[id number]]  Level of Care: Med-Surg [16]  Covid Evaluation: Confirmed COVID Positive  Diagnosis: Pneumonia due to COVID-19 virus [[id number]]  Admitting Physician: Conard Novak  Attending Physician: Conard Novak  Estimated length of stay: 3 - 4 days  Certification:: I certify this patient will need inpatient services for at least 2 midnights       B Medical/Surgery History Past Medical History:  Diagnosis Date  . Allergic rhinitis   . Asthma   . Obesity (BMI 30-39.9)    Past Surgical History:  Procedure Laterality Date  . ADENOIDECTOMY    . TONSILLECTOMY       A IV Location/Drains/Wounds Patient Lines/Drains/Airways Status   Active Line/Drains/Airways    Name:   Placement date:   Placement time:   Site:   Days:   Peripheral IV 05/23/19 Right Antecubital   05/23/19    0730    Antecubital   less than 1          Intake/Output Last 24 hours  Intake/Output Summary (Last 24 hours) at 05/23/2019 2237 Last data filed at 05/23/2019 1444 Gross per 24 hour  Intake 750 ml  Output --  Net 750 ml    Labs/Imaging Results for  orders placed or performed during the hospital encounter of 05/23/19 (from the past 48 hour(s))  Lactic acid, plasma     Status: None   Collection Time: 05/23/19  7:30 AM  Result Value Ref Range   Lactic Acid, Venous 1.3 0.5 - 1.9 mmol/L    Comment: Performed at [facility], [address] [place], [place] [address]  CBC WITH DIFFERENTIAL     Status: Abnormal   Collection Time: 05/23/19  7:30 AM  Result Value Ref Range   WBC 10.7 (H) 4.0 - 10.5 K/uL   RBC 5.63 4.22 - 5.81 MIL/uL   Hemoglobin 16.2 13.0 - 17.0 g/dL   HCT 01.6 01.0 - 93.2 %   MCV 82.4 80.0 - 100.0 fL   MCH 28.8 26.0 - 34.0 pg   MCHC 34.9 30.0 - 36.0 g/dL   RDW 35.5 73.2 - 20.2 %   Platelets 243 150 - 400 K/uL   nRBC 0.0 0.0 - 0.2 %   Neutrophils Relative % 84 %   Neutro Abs 8.9 (H) 1.7 - 7.7 K/uL   Lymphocytes Relative 11 %   Lymphs Abs 1.2 0.7 - 4.0 K/uL   Monocytes Relative 4 %   Monocytes Absolute 0.5 0.1 - 1.0 K/uL   Eosinophils Relative 0 %   Eosinophils Absolute 0.0 0.0 - 0.5 K/uL   Basophils Relative 0 %  Basophils Absolute 0.0 0.0 - 0.1 K/uL   Immature Granulocytes 1 %   Abs Immature Granulocytes 0.06 0.00 - 0.07 K/uL    Comment: Performed at [facility], [address] [place], [place] [address]  Comprehensive metabolic panel     Status: Abnormal   Collection Time: 05/23/19  7:30 AM  Result Value Ref Range   Sodium 132 (L) 135 - 145 mmol/L   Potassium 3.2 (L) 3.5 - 5.1 mmol/L   Chloride 97 (L) 98 - 111 mmol/L   CO2 21 (L) 22 - 32 mmol/L   Glucose, Bld 135 (H) 70 - 99 mg/dL   BUN 15 6 - 20 mg/dL   Creatinine, Ser 0.91 0.61 - 1.24 mg/dL   Calcium 8.4 (L) 8.9 - 10.3 mg/dL   Total Protein 8.3 (H) 6.5 - 8.1 g/dL   Albumin 3.7 3.5 - 5.0 g/dL   AST 194 (H) 15 - 41 U/L   ALT 260 (H) 0 - 44 U/L   Alkaline Phosphatase 65 38 - 126 U/L   Total Bilirubin 2.1 (H) 0.3 - 1.2 mg/dL   GFR calc non Af Amer >60 >60 mL/min   GFR calc Af Amer >60 >60 mL/min   Anion gap 14 5 - 15     Comment: Performed at [facility], [address] [place], [place] [address]  Fibrin derivatives D-Dimer     Status: Abnormal   Collection Time: 05/23/19  7:30 AM  Result Value Ref Range   Fibrin derivatives D-dimer ([facility]) 520.42 (H) 0.00 - 499.00 ng/mL (FEU)    Comment: (NOTE) <> Exclusion of Venous Thromboembolism (VTE) - OUTPATIENT ONLY   (Emergency Department or [place])   0-499 ng/ml (FEU): With a low to intermediate pretest probability                      for VTE this test result excludes the diagnosis                      of VTE.   >499 ng/ml (FEU) : VTE not excluded; additional work up for VTE is                      required. <> Testing on Inpatients and Evaluation of Disseminated Intravascular   Coagulation (DIC) Reference Range:   0-499 ng/ml (FEU) Performed at [facility], [address] [place], [place] [address]   Procalcitonin     Status: None   Collection Time: 05/23/19  7:30 AM  Result Value Ref Range   Procalcitonin <0.10 ng/mL    Comment:        Interpretation: PCT (Procalcitonin) <= 0.5 ng/mL: Systemic infection (sepsis) is not likely. Local bacterial infection is possible. (NOTE)       Sepsis PCT Algorithm           Lower Respiratory Tract                                      Infection PCT Algorithm    ----------------------------     ----------------------------         PCT < 0.25 ng/mL                PCT < 0.10 ng/mL         Strongly encourage  Strongly discourage   discontinuation of antibiotics    initiation of antibiotics    ----------------------------     -----------------------------       PCT 0.25 - 0.50 ng/mL            PCT 0.10 - 0.25 ng/mL               OR       >80% decrease in PCT            Discourage initiation of                                            antibiotics      Encourage discontinuation           of antibiotics    ----------------------------     -----------------------------         PCT >=  0.50 ng/mL              PCT 0.26 - 0.50 ng/mL               AND        <80% decrease in PCT             Encourage initiation of                                             antibiotics       Encourage continuation           of antibiotics    ----------------------------     -----------------------------        PCT >= 0.50 ng/mL                  PCT > 0.50 ng/mL               AND         increase in PCT                  Strongly encourage                                      initiation of antibiotics    Strongly encourage escalation           of antibiotics                                     -----------------------------                                           PCT <= 0.25 ng/mL                                                 OR                                        >  80% decrease in PCT                                     Discontinue / Do not initiate                                             antibiotics Performed at [facility], [address] [place], [place] [address]   Lactate dehydrogenase     Status: Abnormal   Collection Time: 05/23/19  7:30 AM  Result Value Ref Range   LDH 428 (H) 98 - 192 U/L    Comment: Performed at [facility], [address] [place], [place] [address]  C-reactive protein     Status: Abnormal   Collection Time: 05/23/19  7:30 AM  Result Value Ref Range   CRP 8.9 (H) <1.0 mg/dL    Comment: Performed at [facility], [address] [place], [place] [address]  Troponin I (High Sensitivity)     Status: None   Collection Time: 05/23/19 12:37 PM  Result Value Ref Range   Troponin I (High Sensitivity) 3 <18 ng/L    Comment: (NOTE) Elevated high sensitivity troponin I (hsTnI) values and significant  changes across serial measurements may suggest ACS but many other  chronic and acute conditions are known to elevate hsTnI results.  Refer to the "Links" section for chest pain algorithms and additional  guidance. Performed  at [facility], [address] [place], [place] [address]   HIV Antibody (routine testing w rflx)     Status: None   Collection Time: 05/23/19 12:37 PM  Result Value Ref Range   HIV Screen 4th Generation wRfx NON REACTIVE NON REACTIVE    Comment: Performed at [facility], [address], [place], [place] [address]  Ferritin     Status: Abnormal   Collection Time: 05/23/19 12:37 PM  Result Value Ref Range   Ferritin 801 (H) 24 - 336 ng/mL    Comment: Performed at [facility], [address] [place], [place] [address]  POC SARS Coronavirus 2 Ag     Status: Abnormal   Collection Time: 05/23/19  7:07 PM  Result Value Ref Range   SARS Coronavirus 2 Ag POSITIVE (A) NEGATIVE    Comment: (NOTE) SARS-CoV-2 antigen PRESENT. Positive results indicate the presence of viral antigens, but clinical correlation with patient history and other diagnostic information is necessary to determine patient infection status.  Positive results do not rule out bacterial infection or co-infection  with other viruses. False positive results are rare but can occur, and confirmatory RT-PCR testing may be appropriate in some circumstances. The expected result is Negative. Fact Sheet for Patients: https://sanders-williams.net/ Fact Sheet for Providers: https://[place].com/  This test is not yet approved or cleared by the [place] FDA and  has been authorized for detection and/or diagnosis of SARS-CoV-2 by FDA under an Emergency Use Authorization (EUA).  This EUA will remain in effect (meaning this test can be used) for the duration of  the COVID-19 declaration under Section 564(b)(1) of the Act, 21 U.S.C. section 360bbb-3(b)(1), unless the a uthorization is terminated or revoked sooner.    DG Chest Port 1 View  Result Date: 05/23/2019 CLINICAL DATA:  Shortness of breath.  COVID-19 positive EXAM:  PORTABLE CHEST 1 VIEW COMPARISON:  May 18, 2019 chest radiograph and chest CT May 19, 2019 FINDINGS: There is airspace opacity in both mid and lower lung zones with an overall slight increase in opacity compared to recent study. No frank consolidation. Heart is upper normal in size with pulmonary vascularity normal. No adenopathy. No bone lesions. IMPRESSION: Patchy airspace opacity bilaterally, with an overall slight increase in opacity. The area of somewhat irregular opacity in the left upper lobe seen on recent CT is not appreciable by radiography; there may be focal clearing in this area of the left upper lobe compared to previous study. Stable cardiac silhouette. No adenopathy. The overall appearance is consistent with atypical organism pneumonia. Electronically Signed   By: Bretta Bang III M.D.   On: 05/23/2019 07:59    Pending Labs Unresulted Labs (From admission, onward)    Start     Ordered   05/24/19 0500  CBC with Differential/Platelet  Tomorrow morning,   STAT     05/23/19 1156   05/24/19 0500  Comprehensive metabolic panel  Tomorrow morning,   STAT     05/23/19 1156   05/24/19 0500  C-reactive protein  Tomorrow morning,   STAT     05/23/19 1156   05/24/19 0500  Fibrin derivatives D-Dimer ([facility] only)  Tomorrow morning,   STAT     05/23/19 1156   05/24/19 0500  Ferritin  Tomorrow morning,   STAT     05/23/19 1156   05/23/19 0717  Lactic acid, plasma  Now then every 2 hours,   STAT     05/23/19 0717   05/23/19 0717  Blood Culture (routine x 2)  BLOOD CULTURE X 2,   STAT     05/23/19 0717   Signed and Held  SARS CORONAVIRUS 2 (TAT 6-24 HRS) Nasopharyngeal Nasopharyngeal Swab  (Tier 3 (TAT 6-24 hrs))  Once,   R    Question Answer Comment  Is this test for diagnosis or screening Screening   Symptomatic for COVID-19 as defined by CDC No   Hospitalized for COVID-19 No   Admitted to ICU for COVID-19 No   Previously tested for COVID-19 No   Resident in a congregate (group) care setting No   Employed in healthcare  setting No      Signed and Held          Vitals/Pain Today's Vitals   05/23/19 2000 05/23/19 2030 05/23/19 2119 05/23/19 2142  BP: (!) 147/92 136/81 (!) 147/88   Pulse: (!) 114 (!) 115 (!) 119   Resp:   (!) 24   Temp:      TempSrc:      SpO2: (!) 88% 93% 93%   Weight:      Height:      PainSc:    0-No pain    Isolation Precautions Airborne and Contact precautions  Medications Medications  dexamethasone (DECADRON) injection 6 mg (has no administration in time range)  enoxaparin (LOVENOX) injection 40 mg (40 mg Subcutaneous Given 05/23/19 2202)  acetaminophen (TYLENOL) tablet 650 mg (650 mg Oral Given 05/23/19 1752)  ondansetron (ZOFRAN) tablet 4 mg (has no administration in time range)    Or  ondansetron (ZOFRAN) injection 4 mg (has no administration in time range)  remdesivir 200 mg in sodium chloride 0.9% 250 mL IVPB (0 mg Intravenous Stopped 05/23/19 1444)    Followed by  remdesivir 100 mg in sodium chloride 0.9 % 100 mL IVPB (has no administration in time range)  dexamethasone (DECADRON) injection 10 mg (10 mg Intravenous Given 05/23/19 0803)  sodium chloride 0.9 % bolus 500 mL (0 mLs Intravenous Stopped 05/23/19 0904)    Mobility walks Low fall risk   Focused Assessments Pulmonary Assessment Handoff:  Lung sounds:   O2 Device: Nasal Cannula O2 Flow Rate (L/min): 2 L/min      R Recommendations: See Admitting Provider Note  Report given to:   Additional Notes:

## 2019-05-23 NOTE — ED Notes (Signed)
This RN attempted to call report. Secretary informed this RN that Gearldine Bienenstock, RN would call back shortly for report.

## 2019-05-23 NOTE — ED Notes (Signed)
Pt ambulatory approximately 179ft; room air saturation 87-89% with ambulation.  EDP, Williams aware.

## 2019-05-23 NOTE — ED Notes (Signed)
ED tech Criss Alvine pt to use the bathroom, and pt was provided with lunch tray.

## 2019-05-23 NOTE — Progress Notes (Signed)
Remdesivir - Pharmacy Brief Note   O:  ALT: 260 CXR: Patchy airspace opacity bilaterally, with an overall slight increase in opacity SpO2: 90-95% on 2LNC   A/P:  Remdesivir 200 mg IVPB once followed by 100 mg IVPB daily x 4 days.   Laureen Ochs, PharmD 05/23/2019 12:09 PM

## 2019-05-23 NOTE — ED Notes (Signed)
Attempted to call [facility] for positive COVID test result, was placed on hold and nobody answered.

## 2019-05-23 NOTE — ED Notes (Signed)
Pt on phone with family at bedside at this time

## 2019-05-23 NOTE — ED Triage Notes (Signed)
Pt in via [facility], pt is Covid+, reports acute SOB waking him from his sleep. Per EMS, pt hypoxic, 89% on room air.   Room air sat 91% upon arrival.  Increased work of breathing noted with any exertion.  Pt placed on 2L nasal cannula.  EDP, Williams at bedside.

## 2019-05-23 NOTE — ED Provider Notes (Signed)
[facility] Emergency Department Provider Note       Time seen: ----------------------------------------- 7:18 AM on 05/23/2019 -----------------------------------------   I have reviewed the triage vital signs and the nursing notes.  HISTORY   Chief Complaint Covid    HPI Roy Rowe is a 24 y.o. male with a history of asthma, obesity who presents to the ED for shortness of breath.  Patient is reportedly Covid positive.  Was diagnosed last Wednesday, started feeling sick Saturday before last.  This would be day 9 of his illness.  He complains of fevers, weakness, shortness of breath.  Reportedly was borderline hypoxic and brought in on a nonrebreather.  Past Medical History:  Diagnosis Date  . Allergic rhinitis   . Asthma   . Obesity (BMI 30-39.9)     Patient Active Problem List   Diagnosis Date Noted  . Blepharitis of left eye 03/24/2013  . Hordeolum externum 03/22/2013  . Obesity, unspecified 02/01/2013  . Allergic rhinitis 02/01/2013    Past Surgical History:  Procedure Laterality Date  . ADENOIDECTOMY    . TONSILLECTOMY      Allergies Patient has no known allergies.  Social History Social History   Tobacco Use  . Smoking status: Passive Smoke Exposure - Never Smoker  . Smokeless tobacco: Never Used  Substance Use Topics  . Alcohol use: No  . Drug use: No    Review of Systems Constitutional: Positive for fever Cardiovascular: Negative for chest pain. Respiratory: Positive for shortness of breath Gastrointestinal: Negative for abdominal pain, vomiting and diarrhea. Musculoskeletal: Negative for back pain. Skin: Negative for rash. Neurological: Negative for headaches, focal weakness or numbness.  All systems negative/normal/unremarkable except as stated in the HPI  ____________________________________________   PHYSICAL EXAM:  VITAL SIGNS: ED Triage Vitals  Enc Vitals Group     BP      Pulse      Resp      Temp       Temp src      SpO2      Weight      Height      Head Circumference      Peak Flow      Pain Score      Pain Loc      Pain Edu?      Excl. in GC?    Constitutional: Alert and oriented.  Mild distress Eyes: Conjunctivae are normal. Normal extraocular movements. ENT      Head: Normocephalic and atraumatic.      Nose: No congestion/rhinnorhea.      Mouth/Throat: Mucous membranes are moist.      Neck: No stridor. Cardiovascular: Normal rate, regular rhythm. No murmurs, rubs, or gallops. Respiratory: Tachypnea with clear breath sounds Gastrointestinal: Soft and nontender. Normal bowel sounds Musculoskeletal: Nontender with normal range of motion in extremities. No lower extremity tenderness nor edema. Neurologic:  Normal speech and language. No gross focal neurologic deficits are appreciated.  Skin:  Skin is warm, dry and intact.  Diaphoresis is noted Psychiatric: Mood and affect are normal. Speech and behavior are normal.  ____________________________________________  EKG: Interpreted by me.  Sinus tachycardia at a rate of 124 bpm, normal PR interval, normal axis, normal QT  ____________________________________________  ED COURSE:  As part of my medical decision making, I reviewed the following data within the electronic MEDICAL RECORD NUMBER History obtained from family if available, nursing notes, old chart and ekg, as well as notes from prior ED visits. Patient presented  for dyspnea in the setting of COVID-19, we will assess with labs and imaging as indicated at this time.   Procedures  MINARD MILLIRONS was evaluated in Emergency Department on 05/23/2019 for the symptoms described in the history of present illness. He was evaluated in the context of the global COVID-19 pandemic, which necessitated consideration that the patient might be at risk for infection with the SARS-CoV-2 virus that causes COVID-19. Institutional protocols and algorithms that pertain to the evaluation of patients  at risk for COVID-19 are in a state of rapid change based on information released by regulatory bodies including the CDC and federal and state organizations. These policies and algorithms were followed during the patient's care in the ED.  ____________________________________________   LABS (pertinent positives/negatives)  Labs Reviewed  CBC WITH DIFFERENTIAL/PLATELET - Abnormal; Notable for the following components:      Result Value   WBC 10.7 (*)    Neutro Abs 8.9 (*)    All other components within normal limits  COMPREHENSIVE METABOLIC PANEL - Abnormal; Notable for the following components:   Sodium 132 (*)    Potassium 3.2 (*)    Chloride 97 (*)    CO2 21 (*)    Glucose, Bld 135 (*)    Calcium 8.4 (*)    Total Protein 8.3 (*)    AST 194 (*)    ALT 260 (*)    Total Bilirubin 2.1 (*)    All other components within normal limits  FIBRIN DERIVATIVES D-DIMER ([facility] ONLY) - Abnormal; Notable for the following components:   Fibrin derivatives D-dimer ([facility]) 520.42 (*)    All other components within normal limits  LACTATE DEHYDROGENASE - Abnormal; Notable for the following components:   LDH 428 (*)    All other components within normal limits  CULTURE, BLOOD (ROUTINE X 2)  CULTURE, BLOOD (ROUTINE X 2)  LACTIC ACID, PLASMA  PROCALCITONIN  LACTIC ACID, PLASMA  C-REACTIVE PROTEIN  TROPONIN I (HIGH SENSITIVITY)   CRITICAL CARE Performed by: Laurence Aly   Total critical care time: 30 minutes  Critical care time was exclusive of separately billable procedures and treating other patients.  Critical care was necessary to treat or prevent imminent or life-threatening deterioration.  Critical care was time spent personally by me on the following activities: development of treatment plan with patient and/or surrogate as well as nursing, discussions with consultants, evaluation of patient's response to treatment, examination of patient, obtaining history from patient or  surrogate, ordering and performing treatments and interventions, ordering and review of laboratory studies, ordering and review of radiographic studies, pulse oximetry and re-evaluation of patient's condition.  RADIOLOGY  Chest x-ray IMPRESSION:  Patchy airspace opacity bilaterally, with an overall slight increase  in opacity. The area of somewhat irregular opacity in the left upper  lobe seen on recent CT is not appreciable by radiography; there may  be focal clearing in this area of the left upper lobe compared to  previous study.   Stable cardiac silhouette. No adenopathy. The overall appearance is  consistent with atypical organism pneumonia.  ____________________________________________   DIFFERENTIAL DIAGNOSIS   COVID-19, pneumonia, PE, pneumothorax, dehydration, electrolyte abnormality  FINAL ASSESSMENT AND PLAN  COVID-19, hypoxia   Plan: The patient had presented for shortness of breath in the setting of COVID-19. Patient's labs look as expected with Covid. Patient's imaging did reveal some worsening airspace disease bilaterally.  He was ambulated and his sats dropped to 87%.  He was placed on 2 L  nasal cannula oxygen.  He did receive IV steroids.  I will discuss with the hospitalist for admission.   Ulice Dash, MD    Note: This note was generated in part or whole with voice recognition software. Voice recognition is usually quite accurate but there are transcription errors that can and very often do occur. I apologize for any typographical errors that were not detected and corrected.     Emily Filbert, MD 05/23/19 1020

## 2019-05-24 ENCOUNTER — Encounter: Payer: Self-pay | Admitting: Internal Medicine

## 2019-05-24 LAB — CBC WITH DIFFERENTIAL/PLATELET
Abs Immature Granulocytes: 0.06 10*3/uL (ref 0.00–0.07)
Basophils Absolute: 0 10*3/uL (ref 0.0–0.1)
Basophils Relative: 0 %
Eosinophils Absolute: 0 10*3/uL (ref 0.0–0.5)
Eosinophils Relative: 0 %
HCT: 45 % (ref 39.0–52.0)
Hemoglobin: 15.3 g/dL (ref 13.0–17.0)
Immature Granulocytes: 1 %
Lymphocytes Relative: 14 %
Lymphs Abs: 1.4 10*3/uL (ref 0.7–4.0)
MCH: 28.2 pg (ref 26.0–34.0)
MCHC: 34 g/dL (ref 30.0–36.0)
MCV: 83 fL (ref 80.0–100.0)
Monocytes Absolute: 0.6 10*3/uL (ref 0.1–1.0)
Monocytes Relative: 6 %
Neutro Abs: 7.6 10*3/uL (ref 1.7–7.7)
Neutrophils Relative %: 79 %
Platelets: 261 10*3/uL (ref 150–400)
RBC: 5.42 MIL/uL (ref 4.22–5.81)
RDW: 12.1 % (ref 11.5–15.5)
WBC: 9.7 10*3/uL (ref 4.0–10.5)
nRBC: 0 % (ref 0.0–0.2)

## 2019-05-24 LAB — COMPREHENSIVE METABOLIC PANEL
ALT: 337 U/L — ABNORMAL HIGH (ref 0–44)
AST: 171 U/L — ABNORMAL HIGH (ref 15–41)
Albumin: 3.4 g/dL — ABNORMAL LOW (ref 3.5–5.0)
Alkaline Phosphatase: 70 U/L (ref 38–126)
Anion gap: 12 (ref 5–15)
BUN: 17 mg/dL (ref 6–20)
CO2: 25 mmol/L (ref 22–32)
Calcium: 8.7 mg/dL — ABNORMAL LOW (ref 8.9–10.3)
Chloride: 99 mmol/L (ref 98–111)
Creatinine, Ser: 0.74 mg/dL (ref 0.61–1.24)
GFR calc Af Amer: >60 mL/min (ref >60)
GFR calc non Af Amer: >60 mL/min (ref >60)
Glucose, Bld: 137 mg/dL — ABNORMAL HIGH (ref 70–99)
Potassium: 3.7 mmol/L (ref 3.5–5.1)
Sodium: 136 mmol/L (ref 135–145)
Total Bilirubin: 1.3 mg/dL — ABNORMAL HIGH (ref 0.3–1.2)
Total Protein: 7.7 g/dL (ref 6.5–8.1)

## 2019-05-24 LAB — C-REACTIVE PROTEIN: CRP: 13 mg/dL — ABNORMAL HIGH (ref <1.0)

## 2019-05-24 LAB — FERRITIN: Ferritin: 1125 ng/mL — ABNORMAL HIGH (ref 24–336)

## 2019-05-24 LAB — FIBRIN DERIVATIVES D-DIMER (ARMC ONLY): Fibrin derivatives D-dimer (ARMC): 591.94 ng/mL (FEU) — ABNORMAL HIGH (ref 0.00–499.00)

## 2019-05-24 MED ORDER — SODIUM CHLORIDE 0.9 % IV SOLN
INTRAVENOUS | Status: DC | PRN
  Administered 2019-05-24 – 2019-05-26 (×2): 20 mL via INTRAVENOUS

## 2019-05-24 MED ORDER — INFLUENZA VAC SPLIT QUAD 0.5 ML IM SUSY
0.5000 mL | PREFILLED_SYRINGE | INTRAMUSCULAR | Status: DC
  Filled 2019-05-24: qty 0.5

## 2019-05-24 NOTE — Progress Notes (Signed)
Roy Rowe [phone]: designated contact: Mother.  Roy Rowe had been called and updated on pt status.

## 2019-05-24 NOTE — Progress Notes (Signed)
Designated contact Marchelle Folks called @ [phone], provided pt status update. All questions addressed at time of call

## 2019-05-24 NOTE — Progress Notes (Signed)
PROGRESS NOTE    Roy Rowe  NLZ:[id number] DOB: 04/09/1996 DOA: 05/23/2019 PCP: Patient, [name]    Brief Narrative:  Roy Rowe is an 24 y.o. male with medical history significant for morbid obesity, asthma, allergic rhinitis, presented to the hospital because of shortness of breath.  He said he [facility].  He has been feeling weak, tired and feverish at home.  His temperature has been above 101 at times.  Shortness of breath is usually worse with exertion.  In er he was  hypertensive, tachypneic, tachycardic and hypoxemic in the emergency room but he was afebrile.  Oxygen saturation was 89% on room air. He was placed on 2 L of oxygen via nasal cannula.  Ferritin was 801, LDH 428 and CRP 8.9.  Chest x-ray showed bilateral opacities.      Consultants:   None  Procedures: None  Antimicrobials:   Remdesivir   Subjective: Patient on telemetry is sinus tachycardia.  He is mildly short of breath.  He states his shortness of breath is little better but not at baseline.  No chest pain  Objective: Vitals:   05/24/19 1450 05/24/19 1520 05/24/19 1624 05/24/19 1829  BP: 111/77  124/80 (!) 149/98  Pulse: 99 99 (!) 101 (!) 103  Resp: (!) 28  (!) 24 (!) 27  Temp: 98.6 F (37 C)  98.9 F (37.2 C) 98.3 F (36.8 C)  TempSrc: Oral  Oral Oral  SpO2: 92%  93% 94%  Weight:      Height:        Intake/Output Summary (Last 24 hours) at 05/24/2019 1836 Last data filed at 05/24/2019 1140 Gross per 24 hour  Intake 147.62 ml  Output 300 ml  Net -152.38 ml   Filed Weights   05/23/19 0722  Weight: (!) 167.8 kg    Examination:  General exam: Tachypneic, not using accessory muscles,, laying in bed Respiratory system: Clear to auscultation. Respiratory effort normal.  No rales rhonchi's decreased breath sounds Cardiovascular system: S1 & S2 heard, regular tachy no  murmurs, rubs, gallops or clicks. No pedal  edema. Gastrointestinal system: Abdomen is nondistended, soft and nontender.  Obese normal bowel sounds heard. Central nervous system: Alert and oriented x3. No focal neurological deficits. Extremities: No edema Skin: Warm dry Psychiatry: Judgement and insight appear normal. Mood & affect appropriate.     Data Reviewed: I have personally reviewed following labs and imaging studies  CBC: Recent Labs  Lab 05/18/19 2205 05/23/19 0730 05/24/19 0407  WBC 5.8 10.7* 9.7  NEUTROABS  --  8.9* 7.6  HGB 16.4 16.2 15.3  HCT 48.3 46.4 45.0  MCV 84.1 82.4 83.0  PLT 161 243 261   Basic Metabolic Panel: Recent Labs  Lab 05/18/19 2205 05/23/19 0730 05/24/19 0407  NA 137 132* 136  K 3.5 3.2* 3.7  CL 104 97* 99  CO2 21* 21* 25  GLUCOSE 127* 135* 137*  BUN 15 15 17   CREATININE 0.99 0.91 0.74  CALCIUM 8.8* 8.4* 8.7*   GFR: Estimated Creatinine Clearance: 240.5 mL/min (by C-G formula based on SCr of 0.74 mg/dL). Liver Function Tests: Recent Labs  Lab 05/23/19 0730 05/24/19 0407  AST 194* 171*  ALT 260* 337*  ALKPHOS 65 70  BILITOT 2.1* 1.3*  PROT 8.3* 7.7  ALBUMIN 3.7 3.4*   No results for input(s): LIPASE, AMYLASE in the last 168 hours. No results for input(s): AMMONIA in  the last 168 hours. Coagulation Profile: No results for input(s): INR, PROTIME in the last 168 hours. Cardiac Enzymes: No results for input(s): CKTOTAL, CKMB, CKMBINDEX, TROPONINI in the last 168 hours. BNP (last 3 results) No results for input(s): PROBNP in the last 8760 hours. HbA1C: No results for input(s): HGBA1C in the last 72 hours. CBG: No results for input(s): GLUCAP in the last 168 hours. Lipid Profile: No results for input(s): CHOL, HDL, LDLCALC, TRIG, CHOLHDL, LDLDIRECT in the last 72 hours. Thyroid Function Tests: No results for input(s): TSH, T4TOTAL, FREET4, T3FREE, THYROIDAB in the last 72 hours. Anemia Panel: Recent Labs    05/23/19 1237 05/24/19 0407  FERRITIN 801* 1,125*    Sepsis Labs: Recent Labs  Lab 05/19/19 0018 05/23/19 0730  PROCALCITON <0.10 <0.10  LATICACIDVEN  --  1.3    Recent Results (from the past 240 hour(s))  Blood Culture (routine x 2)     Status: None (Preliminary result)   Collection Time: 05/23/19  7:30 AM   Specimen: BLOOD  Result Value Ref Range Status   Specimen Description BLOOD BLOOD RIGHT ARM  Final   Special Requests   Final    BOTTLES DRAWN AEROBIC AND ANAEROBIC Blood Culture adequate volume   Culture   Final    NO GROWTH < 24 HOURS Performed at [facility], [address], [place], [place] [address]    Report Status PENDING  Incomplete  Blood Culture (routine x 2)     Status: None (Preliminary result)   Collection Time: 05/23/19  7:30 AM   Specimen: BLOOD  Result Value Ref Range Status   Specimen Description BLOOD RIGHT ANTECUBITAL  Final   Special Requests   Final    BOTTLES DRAWN AEROBIC AND ANAEROBIC Blood Culture adequate volume   Culture   Final    NO GROWTH < 24 HOURS Performed at [facility], [address], [place], [place] [address]    Report Status PENDING  Incomplete         Radiology Studies: DG Chest Port 1 View  Result Date: 05/23/2019 CLINICAL DATA:  Shortness of breath.  COVID-19 positive EXAM: PORTABLE CHEST 1 VIEW COMPARISON:  May 18, 2019 chest radiograph and chest CT May 19, 2019 FINDINGS: There is airspace opacity in both mid and lower lung zones with an overall slight increase in opacity compared to recent study. No frank consolidation. Heart is upper normal in size with pulmonary vascularity normal. No adenopathy. No bone lesions. IMPRESSION: Patchy airspace opacity bilaterally, with an overall slight increase in opacity. The area of somewhat irregular opacity in the left upper lobe seen on recent CT is not appreciable by radiography; there may be focal clearing in this area of the left upper lobe compared to previous study. Stable cardiac silhouette. No  adenopathy. The overall appearance is consistent with atypical organism pneumonia. Electronically Signed   By: Bretta Bang III M.D.   On: 05/23/2019 07:59        Scheduled Meds: . dexamethasone (DECADRON) injection  6 mg Intravenous Q24H  . enoxaparin (LOVENOX) injection  40 mg Subcutaneous Q12H  . [START ON 05/25/2019] influenza vac split quadrivalent PF  0.5 mL Intramuscular Tomorrow-1000   Continuous Infusions: . sodium chloride Stopped (05/24/19 1019)  . remdesivir 100 mg in NS 100 mL Stopped (05/24/19 0956)    Assessment & Plan:   Principal Problem:   Pneumonia due to COVID-19 virus Active Problems:   Morbid obesity (HCC)   Acute hypoxemic respiratory failure (  [place])   Body mass index is 47.51 kg/m.  (Morbid obesity)  COVID-19 pneumonia: Continue with IV dexamethasone and remdesivir.  Covid positive Continue Covid protocol  Acute hypoxemic respiratory failure:  He is now requiring 4 L O2, if he continues requiring more we will need to transfer him to [facility].    Elevated blood pressure: Not a known hypertensive.  at times its controlled. Continue to monitor  Asthma: Compensated.    DVT prophylaxis: Lovenox Code Status: Full code. Family Communication:  None at bedside Disposition Plan: Possible discharge to home in 2 to 3 days when shortness of breath is at baseline and hemodynamically stable.  Currently he is requiring 4 L of oxygen.        LOS: 1 day   Time spent: 45 minutes with more than 50% COC    Nolberto Hanlon, MD [facility] Pager [phone]  If 7PM-7AM, please contact night-coverage [email or web address] Password [facility] 05/24/2019, 6:36 PM

## 2019-05-24 NOTE — Progress Notes (Signed)
   05/24/19 0834  Vitals  Temp 98.5 F (36.9 C)  Temp Source Oral  BP (!) 120/102  MAP (mmHg) 110  BP Location Left Arm  BP Method Automatic  Patient Position (if appropriate) Lying  Pulse Rate (!) 127  Resp (!) 22  Oxygen Therapy  SpO2 93 %  O2 Device Nasal Cannula  O2 Flow Rate (L/min) 2 L/min  MEWS Score  MEWS RR 1  MEWS Pulse 2  MEWS Systolic 0  MEWS LOC 0  MEWS Temp 0  MEWS Score 3  MEWS Score Color Yellow  MEWS Assessment  Is this an acute change? No (MD aware)  Dr. Marylu Lund made aware of HR sustaining in the 120-130s. Orson Ape, BSN

## 2019-05-25 LAB — C-REACTIVE PROTEIN: CRP: 6.9 mg/dL — ABNORMAL HIGH (ref <1.0)

## 2019-05-25 LAB — FIBRIN DERIVATIVES D-DIMER (ARMC ONLY): Fibrin derivatives D-dimer (ARMC): 487.51 ng/mL (FEU) (ref 0.00–499.00)

## 2019-05-25 MED ORDER — FUROSEMIDE 10 MG/ML IJ SOLN
40.0000 mg | Freq: Once | INTRAMUSCULAR | Status: AC
  Administered 2019-05-25: 13:00:00 40 mg via INTRAVENOUS
  Filled 2019-05-25: qty 4

## 2019-05-25 NOTE — Progress Notes (Signed)
Patient's oxygen increased from 2L [place] at start of shift to 4L [place].  Patient also with increased HR with a MEWS of 3, Dr. Marylu Lund aware.  Patient denies chest pain.  Patient proned for several hours throughout shift and tolerated well.  Patient sitting up on the side of the bed for all meals.  Remains afebrile.  Day 2 of remdesivir.  Patient's contact person update via phone and all questions answered.  Orson Ape, BSN

## 2019-05-25 NOTE — Plan of Care (Signed)
Spoke with Roy Rowe/mom to update on current POC and patient status.  Understands he may be DC 1/14, but hoping to decrease O2 needs if possible. No further questions at this time.

## 2019-05-25 NOTE — Progress Notes (Signed)
PROGRESS NOTE    Roy Rowe  PZW:[id number] DOB: 02-09-1996 DOA: 05/23/2019 PCP: Patient, [name]    Brief Narrative:  Roy Rowe an 24 y.o.malewith medical history significant for morbid obesity, asthma, allergic rhinitis, presented to the hospital because of shortness of breath. He said [facility]. He has been feeling weak, tired and feverish at home. His temperature has been above 101 at times. Shortness of breath is usually worse with exertion.   1/13: Remains on 4 L supplemental oxygen.  Reports feeling "a little better"   Assessment & Plan:   Principal Problem:   Pneumonia due to COVID-19 virus Active Problems:   Morbid obesity ([place])   Acute hypoxemic respiratory failure ([place])  COVID-19 pneumonia Acute hypoxemic respiratory failure Continue with IV dexamethasone and remdesivir (day 3/5) Supplemental oxygen as needed Titrate as needed Stress incentive spirometry and flutter valve Will attempt Lasix challenge 40 mg IV x1 to wean from oxygen Trend inflammatory markers Supplementary vitamins If oxygen requirement worsens patient may be candidate for transfer to [facility] Patient also may be a candidate for Actemra however his LFTs are approximately 5 times the upper limit of normal, precluding use.  We will recheck CMP in the morning and reevaluate use of this medication  Asthma Compensated. Not in exacerbation  Obesity stage III BMI 47.5 Counseled patient Nutrition consult  Elevated LFTs Suspect NAFLD however medication effect also on differential Right upper quadrant ultrasound Hepatitis panel   DVT prophylaxis: Lovenox Code Status: Full Family Communication: None today  disposition Plan: Home, anticipate 24 to 48 hours, goal to titrate off supplemental oxygen during   Consultants:   None  Procedures:   None  Antimicrobials:   Remdesivir (1/11-    )   Subjective: Patient seen and examined Sleeping but easily awakened Reports mild symptomatic improvement Remains on 4 L supplemental oxygen  Objective: Vitals:   05/24/19 2211 05/25/19 0026 05/25/19 0831 05/25/19 1550  BP: (!) 141/81 116/64 (!) 136/91 128/88  Pulse: 84 88 97 90  Resp: (!) 26 20  15   Temp: 98.7 F (37.1 C) 98 F (36.7 C) 97.7 F (36.5 C) 97.9 F (36.6 C)  TempSrc: Oral Oral Oral Oral  SpO2: 92% 95% 94%   Weight:      Height:        Intake/Output Summary (Last 24 hours) at 05/25/2019 1627 Last data filed at 05/25/2019 4235 Gross per 24 hour  Intake --  Output 1100 ml  Net -1100 ml   Filed Weights   05/23/19 0722  Weight: (!) 167.8 kg    Examination:  General exam: Appears calm and comfortable  Respiratory system: Clear to auscultation. Respiratory effort normal. Cardiovascular system: S1 & S2 heard, RRR. No JVD, murmurs, rubs, gallops or clicks. No pedal edema. Gastrointestinal system: Abdomen is nondistended, soft and nontender. No organomegaly or masses felt. Normal bowel sounds heard. Central nervous system: Alert and oriented. No focal neurological deficits. Extremities: Symmetric 5 x 5 power. Skin: No rashes, lesions or ulcers Psychiatry: Judgement and insight appear normal. Mood & affect appropriate.     Data Reviewed: I have personally reviewed following labs and imaging studies  CBC: Recent Labs  Lab 05/18/19 2205 05/23/19 0730 05/24/19 0407  WBC 5.8 10.7* 9.7  NEUTROABS  --  8.9* 7.6  HGB 16.4 16.2 15.3  HCT 48.3 46.4 45.0  MCV 84.1 82.4 83.0  PLT 161 243 261  Basic Metabolic Panel: Recent Labs  Lab 05/18/19 2205 05/23/19 0730 05/24/19 0407  NA 137 132* 136  K 3.5 3.2* 3.7  CL 104 97* 99  CO2 21* 21* 25  GLUCOSE 127* 135* 137*  BUN 15 15 17   CREATININE 0.99 0.91 0.74  CALCIUM 8.8* 8.4* 8.7*   GFR: Estimated Creatinine Clearance: 240.5 mL/min (by C-G formula based on SCr of 0.74 mg/dL). Liver Function  Tests: Recent Labs  Lab 05/23/19 0730 05/24/19 0407  AST 194* 171*  ALT 260* 337*  ALKPHOS 65 70  BILITOT 2.1* 1.3*  PROT 8.3* 7.7  ALBUMIN 3.7 3.4*   No results for input(s): LIPASE, AMYLASE in the last 168 hours. No results for input(s): AMMONIA in the last 168 hours. Coagulation Profile: No results for input(s): INR, PROTIME in the last 168 hours. Cardiac Enzymes: No results for input(s): CKTOTAL, CKMB, CKMBINDEX, TROPONINI in the last 168 hours. BNP (last 3 results) No results for input(s): PROBNP in the last 8760 hours. HbA1C: No results for input(s): HGBA1C in the last 72 hours. CBG: No results for input(s): GLUCAP in the last 168 hours. Lipid Profile: No results for input(s): CHOL, HDL, LDLCALC, TRIG, CHOLHDL, LDLDIRECT in the last 72 hours. Thyroid Function Tests: No results for input(s): TSH, T4TOTAL, FREET4, T3FREE, THYROIDAB in the last 72 hours. Anemia Panel: Recent Labs    05/23/19 1237 05/24/19 0407  FERRITIN 801* 1,125*   Sepsis Labs: Recent Labs  Lab 05/19/19 0018 05/23/19 0730  PROCALCITON <0.10 <0.10  LATICACIDVEN  --  1.3    Recent Results (from the past 240 hour(s))  Blood Culture (routine x 2)     Status: None (Preliminary result)   Collection Time: 05/23/19  7:30 AM   Specimen: BLOOD  Result Value Ref Range Status   Specimen Description BLOOD BLOOD RIGHT ARM  Final   Special Requests   Final    BOTTLES DRAWN AEROBIC AND ANAEROBIC Blood Culture adequate volume   Culture   Final    NO GROWTH 2 DAYS Performed at [facility], [address] [place], [place]    Report Status PENDING  Incomplete  Blood Culture (routine x 2)     Status: None (Preliminary result)   Collection Time: 05/23/19  7:30 AM   Specimen: BLOOD  Result Value Ref Range Status   Specimen Description BLOOD RIGHT ANTECUBITAL  Final   Special Requests   Final    BOTTLES DRAWN AEROBIC AND ANAEROBIC Blood Culture adequate volume   Culture   Final     NO GROWTH 2 DAYS Performed at [facility], [address] [place], [place]    Report Status PENDING  Incomplete         Radiology Studies: No results found.      Scheduled Meds: . dexamethasone (DECADRON) injection  6 mg Intravenous Q24H  . enoxaparin (LOVENOX) injection  40 mg Subcutaneous Q12H  . influenza vac split quadrivalent PF  0.5 mL Intramuscular Tomorrow-1000   Continuous Infusions: . sodium chloride Stopped (05/24/19 1019)  . remdesivir 100 mg in NS 100 mL 100 mg (05/25/19 0923)     LOS: 2 days    Time spent: 35 minutes    05/27/19, MD [facility] Pager [phone]  If 7PM-7AM, please contact night-coverage [email or web address] Password [facility] 05/25/2019, 4:27 PM

## 2019-05-26 ENCOUNTER — Inpatient Hospital Stay: Payer: 59

## 2019-05-26 LAB — CBC WITH DIFFERENTIAL/PLATELET
Abs Immature Granulocytes: 0.16 10*3/uL — ABNORMAL HIGH (ref 0.00–0.07)
Basophils Absolute: 0 10*3/uL (ref 0.0–0.1)
Basophils Relative: 0 %
Eosinophils Absolute: 0 10*3/uL (ref 0.0–0.5)
Eosinophils Relative: 0 %
HCT: 48.8 % (ref 39.0–52.0)
Hemoglobin: 16.8 g/dL (ref 13.0–17.0)
Immature Granulocytes: 2 %
Lymphocytes Relative: 21 %
Lymphs Abs: 2 10*3/uL (ref 0.7–4.0)
MCH: 28.8 pg (ref 26.0–34.0)
MCHC: 34.4 g/dL (ref 30.0–36.0)
MCV: 83.7 fL (ref 80.0–100.0)
Monocytes Absolute: 0.8 10*3/uL (ref 0.1–1.0)
Monocytes Relative: 8 %
Neutro Abs: 6.4 10*3/uL (ref 1.7–7.7)
Neutrophils Relative %: 69 %
Platelets: 368 10*3/uL (ref 150–400)
RBC: 5.83 MIL/uL — ABNORMAL HIGH (ref 4.22–5.81)
RDW: 12.1 % (ref 11.5–15.5)
Smear Review: NORMAL
WBC: 9.3 10*3/uL (ref 4.0–10.5)
nRBC: 0 % (ref 0.0–0.2)

## 2019-05-26 LAB — COMPREHENSIVE METABOLIC PANEL
ALT: 319 U/L — ABNORMAL HIGH (ref 0–44)
AST: 117 U/L — ABNORMAL HIGH (ref 15–41)
Albumin: 3.3 g/dL — ABNORMAL LOW (ref 3.5–5.0)
Alkaline Phosphatase: 78 U/L (ref 38–126)
Anion gap: 11 (ref 5–15)
BUN: 20 mg/dL (ref 6–20)
CO2: 29 mmol/L (ref 22–32)
Calcium: 9.1 mg/dL (ref 8.9–10.3)
Chloride: 100 mmol/L (ref 98–111)
Creatinine, Ser: 0.71 mg/dL (ref 0.61–1.24)
GFR calc Af Amer: >60 mL/min (ref >60)
GFR calc non Af Amer: >60 mL/min (ref >60)
Glucose, Bld: 115 mg/dL — ABNORMAL HIGH (ref 70–99)
Potassium: 3.9 mmol/L (ref 3.5–5.1)
Sodium: 140 mmol/L (ref 135–145)
Total Bilirubin: 1.1 mg/dL (ref 0.3–1.2)
Total Protein: 7.8 g/dL (ref 6.5–8.1)

## 2019-05-26 LAB — HEPATITIS PANEL, ACUTE
HCV Ab: NONREACTIVE
Hep A IgM: NONREACTIVE
Hep B C IgM: NONREACTIVE
Hepatitis B Surface Ag: NONREACTIVE

## 2019-05-26 LAB — C-REACTIVE PROTEIN: CRP: 4.3 mg/dL — ABNORMAL HIGH (ref <1.0)

## 2019-05-26 LAB — FIBRIN DERIVATIVES D-DIMER (ARMC ONLY): Fibrin derivatives D-dimer (ARMC): 398.91 ng/mL (FEU) (ref 0.00–499.00)

## 2019-05-26 MED ORDER — BOOST / RESOURCE BREEZE PO LIQD CUSTOM
1.0000 | Freq: Two times a day (BID) | ORAL | Status: DC
  Administered 2019-05-26 – 2019-05-27 (×2): 1 via ORAL

## 2019-05-26 MED ORDER — ENSURE MAX PROTEIN PO LIQD
11.0000 [oz_av] | Freq: Two times a day (BID) | ORAL | Status: DC
  Administered 2019-05-26 – 2019-05-27 (×3): 11 [oz_av] via ORAL
  Filled 2019-05-26: qty 330

## 2019-05-26 MED ORDER — PRO-STAT SUGAR FREE PO LIQD
30.0000 mL | Freq: Two times a day (BID) | ORAL | Status: DC
  Administered 2019-05-26 – 2019-05-27 (×3): 30 mL via ORAL

## 2019-05-26 NOTE — Clinical Social Work Note (Signed)
CSW acknowledges consult for PCP needs. CSW following for discharge needs. Will set up with a PCP when we know a discharge date if patient is agreeable.  Charlynn Court, CSW [phone]

## 2019-05-26 NOTE — Progress Notes (Signed)
PROGRESS NOTE    Roy Rowe  NIO:[id number] DOB: 11/20/1995 DOA: 05/23/2019 PCP: Patient, [name]    Brief Narrative:  Roy Rowe an 24 y.o.malewith medical history significant for morbid obesity, asthma, allergic rhinitis, presented to the hospital because of shortness of breath. He said [facility]. He has been feeling weak, tired and feverish at home. His temperature has been above 101 at times. Shortness of breath is usually worse with exertion.   1/13: Remains on 4 L supplemental oxygen.  Reports feeling "a little better"  1/14: Titrated to minimal supplemental oxygen.  Energy level improved.   Assessment & Plan:   Principal Problem:   Pneumonia due to COVID-19 virus Active Problems:   Morbid obesity ([place])   Acute hypoxemic respiratory failure ([place])  COVID-19 pneumonia Acute hypoxemic respiratory failure Continue with IV dexamethasone and remdesivir (day 4/5) Titrate off oxygen as tolerated Goal to be on room air at time of discharge Anticipate discharge in AM  Asthma Compensated. Not in exacerbation  Obesity stage III BMI 47.5 Counseled patient Nutrition consult  Elevated LFTs Ultrasound completed Confirmed NAFLD Discussed with patients mother Strongly recommend outpatient PCP/hepatology followup   DVT prophylaxis: Lovenox Code Status: Full Family Communication: Talked to mother 1/14 disposition Plan: Home, anticipate discharge in AM if patient on RA  Consultants:   None  Procedures:   None  Antimicrobials:   Remdesivir (1/11-   )   Subjective: Patient seen and examined Energy improved Oxygen requirement decreasing  Objective: Vitals:   05/25/19 2355 05/26/19 1050 05/26/19 1100 05/26/19 1310  BP:      Pulse:      Resp:    (!) 21  Temp: 98 F (36.7 C)     TempSrc: Oral     SpO2: 92% 94% 95% 93%  Weight:      Height:        Intake/Output Summary  (Last 24 hours) at 05/26/2019 1554 Last data filed at 05/26/2019 1326 Gross per 24 hour  Intake 316.04 ml  Output 1725 ml  Net -1408.96 ml   Filed Weights   05/23/19 0722  Weight: (!) 167.8 kg    Examination:  General exam: Appears calm and comfortable  Respiratory system: Clear to auscultation. Respiratory effort normal. Cardiovascular system: S1 & S2 heard, RRR. No JVD, murmurs, rubs, gallops or clicks. No pedal edema. Gastrointestinal system: Abdomen is nondistended, soft and nontender. No organomegaly or masses felt. Normal bowel sounds heard. Central nervous system: Alert and oriented. No focal neurological deficits. Extremities: Symmetric 5 x 5 power. Skin: No rashes, lesions or ulcers Psychiatry: Judgement and insight appear normal. Mood & affect appropriate.     Data Reviewed: I have personally reviewed following labs and imaging studies  CBC: Recent Labs  Lab 05/23/19 0730 05/24/19 0407 05/26/19 0718  WBC 10.7* 9.7 9.3  NEUTROABS 8.9* 7.6 6.4  HGB 16.2 15.3 16.8  HCT 46.4 45.0 48.8  MCV 82.4 83.0 83.7  PLT 243 261 818   Basic Metabolic Panel: Recent Labs  Lab 05/23/19 0730 05/24/19 0407 05/26/19 0718  NA 132* 136 140  K 3.2* 3.7 3.9  CL 97* 99 100  CO2 21* 25 29  GLUCOSE 135* 137* 115*  BUN 15 17 20   CREATININE 0.91 0.74 0.71  CALCIUM 8.4* 8.7* 9.1   GFR: Estimated Creatinine Clearance: 240.5 mL/min (by C-G formula based on SCr of 0.71 mg/dL). Liver Function Tests:  Recent Labs  Lab 05/23/19 0730 05/24/19 0407 05/26/19 0718  AST 194* 171* 117*  ALT 260* 337* 319*  ALKPHOS 65 70 78  BILITOT 2.1* 1.3* 1.1  PROT 8.3* 7.7 7.8  ALBUMIN 3.7 3.4* 3.3*   No results for input(s): LIPASE, AMYLASE in the last 168 hours. No results for input(s): AMMONIA in the last 168 hours. Coagulation Profile: No results for input(s): INR, PROTIME in the last 168 hours. Cardiac Enzymes: No results for input(s): CKTOTAL, CKMB, CKMBINDEX, TROPONINI in the last 168  hours. BNP (last 3 results) No results for input(s): PROBNP in the last 8760 hours. HbA1C: No results for input(s): HGBA1C in the last 72 hours. CBG: No results for input(s): GLUCAP in the last 168 hours. Lipid Profile: No results for input(s): CHOL, HDL, LDLCALC, TRIG, CHOLHDL, LDLDIRECT in the last 72 hours. Thyroid Function Tests: No results for input(s): TSH, T4TOTAL, FREET4, T3FREE, THYROIDAB in the last 72 hours. Anemia Panel: Recent Labs    05/24/19 0407  FERRITIN 1,125*   Sepsis Labs: Recent Labs  Lab 05/23/19 0730  PROCALCITON <0.10  LATICACIDVEN 1.3    Recent Results (from the past 240 hour(s))  Blood Culture (routine x 2)     Status: None (Preliminary result)   Collection Time: 05/23/19  7:30 AM   Specimen: BLOOD  Result Value Ref Range Status   Specimen Description BLOOD BLOOD RIGHT ARM  Final   Special Requests   Final    BOTTLES DRAWN AEROBIC AND ANAEROBIC Blood Culture adequate volume   Culture   Final    NO GROWTH 3 DAYS Performed at [facility], [address] [place], [place] [address]    Report Status PENDING  Incomplete  Blood Culture (routine x 2)     Status: None (Preliminary result)   Collection Time: 05/23/19  7:30 AM   Specimen: BLOOD  Result Value Ref Range Status   Specimen Description BLOOD RIGHT ANTECUBITAL  Final   Special Requests   Final    BOTTLES DRAWN AEROBIC AND ANAEROBIC Blood Culture adequate volume   Culture   Final    NO GROWTH 3 DAYS Performed at [facility], [address], [place], [place] [address]    Report Status PENDING  Incomplete         Radiology Studies: US Abdomen Limited RUQ  Result Date: 05/26/2019 CLINICAL DATA:  Elevated liver enzymes EXAM: ULTRASOUND ABDOMEN LIMITED RIGHT UPPER QUADRANT COMPARISON:  None. FINDINGS: Gallbladder: No gallstones or wall thickening visualized. There is no pericholecystic fluid. No sonographic Murphy sign noted by sonographer. Common bile duct:  Diameter: 4 mm. No intrahepatic or extrahepatic biliary duct dilatation. Liver: No focal lesion identified. Liver echogenicity is increased diffusely. Portal vein is patent on color Doppler imaging with normal direction of blood flow towards the liver. Other: None. IMPRESSION: Diffuse increase in liver echogenicity, a finding indicative of hepatic steatosis. No focal liver lesions are evident; it must be cautioned that the sensitivity of ultrasound for detection of focal lesions is diminished in this circumstance. Study otherwise unremarkable. Electronically Signed   By: Bretta Bang III M.D.   On: 05/26/2019 08:44        Scheduled Meds: . dexamethasone (DECADRON) injection  6 mg Intravenous Q24H  . enoxaparin (LOVENOX) injection  40 mg Subcutaneous Q12H  . feeding supplement  1 Container Oral BID WC  . feeding supplement (PRO-STAT SUGAR FREE 64)  30 mL Oral BID  . influenza vac split quadrivalent PF  0.5 mL  Intramuscular Tomorrow-1000  . Ensure Max Protein  11 oz Oral BID   Continuous Infusions: . sodium chloride Stopped (05/26/19 1103)  . remdesivir 100 mg in NS 100 mL Stopped (05/26/19 1044)     LOS: 3 days    Time spent: 35 minutes    Tresa Moore, MD [facility] Pager [phone]  If 7PM-7AM, please contact night-coverage [email or web address] Password [facility] 05/26/2019, 3:54 PM

## 2019-05-26 NOTE — Progress Notes (Signed)
Ch spoke with Pt on the phone. Pt said that he does not even remember when he got to the hospital, but " I'm definitely feeling better" Pt shared about   05/26/19 1533  Clinical Encounter Type  Visited With Patient  Visit Type Initial;Spiritual support;Social support  Referral From Other (Comment)  Consult/Referral To Chaplain;None;Other (Comment)  Spiritual Encounters  Spiritual Needs Prayer;Emotional  Stress Factors  Patient Stress Factors Health changes  Family Stress Factors Family relationships   being in touch with family and said " If you could pray for me and my family, that would be nice". Ch prayed with Pt.

## 2019-05-26 NOTE — Progress Notes (Signed)
Initial Nutrition Assessment  DOCUMENTATION CODES:   Morbid obesity  INTERVENTION:  Vital Cuisine po daily with breakfast, each supplement provides 520 kcal and 22 grams of protein  Boost Breeze po BID with lunch and dinner, each supplement provides 250 kcal and 9 grams of protein  Ensure Max po BID, each supplement provides 150 kcal and 30 grams of protein  Prostat 30 ml po BID, each supplement provides 100 kcal and 15 grams of protein  Encourage po intake  NUTRITION DIAGNOSIS:   Inadequate oral intake related to catabolic illness(pneumonia due to COVID-19 virus) as evidenced by per patient/family report, energy intake < or equal to 50% for > or equal to 5 days(impaired taste limiting oral intake of protein and calories).   GOAL:   Patient will meet greater than or equal to 90% of their needs   MONITOR:   Labs, I & O's, Supplement acceptance, PO intake, Weight trends  REASON FOR ASSESSMENT:   Consult Assessment of nutrition requirement/status  ASSESSMENT:  RD working remotely.   24 year old male with past medical history of asthma, allergic rhinitis, morbid obesity, and positive COVID-19 test at [facility]. Patient presented to ED with worsening SOB and complains of feeling weak, tired, and feverish. In ED, he was placed on 2 L O2,  CXR showed bilateral opacities and admitted with pneumonia due to COVID-19 virus.  Per notes, patient currently on 4 L supplemental oxygen and reports mild symptomatic improvements.   Spoke with pt via phone this morning. Patient reports having an ultrasound this morning and has not yet had breakfast. He endorses mild improvements to appetite/intake over the past day and recalls eating fruits, spaghetti, spinach pizza, and drinking lots of water and juices during admission. Patient endorses altered taste with meats and stated all meat taste gross. He currently enjoys fruits (apples, pineapple and melon; does not care for  grapes)  Reports usual intake of 3 meals/day at home: Oatmeal for breakfast, sandwich or left overs for lunch, and recalls things such as meatloaf or carnitas for dinner.  Patient with inadequate intake of protein and calories given dietary recall during admission as well as reports of dysgeusia and is at risk for malnutrition in the context of acute illness. Discussed the importance of adequate nutrition and encouraged po intake. Patient amenable to receiving ONS during admission. RD will provide Vital Cuisine with breakfast, Boost breeze with lunch and dinner, Ensure Max between meals and Prostat to aid with calorie/ protein needs.  Current wt 167.8 kg (369.16 lbs) Patient denies any recent changes to weight, unsure of usual body weight.  No recent weight history available for review. Last weight prior to admission 104.3 kg (229.46 lbs) recorded January 2018.   Medications reviewed and include: Dexamethasone, Lovenox 40 mg every 12 hrs, Remdesivir 100 mg daily  Labs: Glucose 115, 137 x 24 hrs, Albumin 3.3 (L), CRP 6.9 (H)  NUTRITION - FOCUSED PHYSICAL EXAM: Unable to complete at this time   Diet Order:   Diet Order            Diet Heart Room service appropriate? Yes; Fluid consistency: Thin  Diet effective now              EDUCATION NEEDS:   Education needs have been addressed  Skin:  Skin Assessment: Reviewed RN Assessment  Last BM:  1/13 (per pt report)  Height:   Ht Readings from Last 1 Encounters:  05/23/19 6\' 2"  (1.88 m)  Weight:   Wt Readings from Last 1 Encounters:  05/23/19 (!) 167.8 kg    Ideal Body Weight:  86.4 kg  BMI:  Body mass index is 47.51 kg/m.  Estimated Nutritional Needs:   Kcal:  3539-1225  Protein:  201-216  Fluid:  > 2.5 L/day   Lajuan Lines, RD, LDN Clinical Nutrition [facility] Telephone [phone] After Hours/Weekend Pager: [phone]

## 2019-05-27 LAB — COMPREHENSIVE METABOLIC PANEL
ALT: 409 U/L — ABNORMAL HIGH (ref 0–44)
AST: 153 U/L — ABNORMAL HIGH (ref 15–41)
Albumin: 3.2 g/dL — ABNORMAL LOW (ref 3.5–5.0)
Alkaline Phosphatase: 76 U/L (ref 38–126)
Anion gap: 8 (ref 5–15)
BUN: 23 mg/dL — ABNORMAL HIGH (ref 6–20)
CO2: 28 mmol/L (ref 22–32)
Calcium: 8.8 mg/dL — ABNORMAL LOW (ref 8.9–10.3)
Chloride: 103 mmol/L (ref 98–111)
Creatinine, Ser: 0.75 mg/dL (ref 0.61–1.24)
GFR calc Af Amer: >60 mL/min (ref >60)
GFR calc non Af Amer: >60 mL/min (ref >60)
Glucose, Bld: 120 mg/dL — ABNORMAL HIGH (ref 70–99)
Potassium: 3.9 mmol/L (ref 3.5–5.1)
Sodium: 139 mmol/L (ref 135–145)
Total Bilirubin: 1.1 mg/dL (ref 0.3–1.2)
Total Protein: 7.3 g/dL (ref 6.5–8.1)

## 2019-05-27 LAB — CBC WITH DIFFERENTIAL/PLATELET
Abs Immature Granulocytes: 0.19 10*3/uL — ABNORMAL HIGH (ref 0.00–0.07)
Basophils Absolute: 0 10*3/uL (ref 0.0–0.1)
Basophils Relative: 0 %
Eosinophils Absolute: 0 10*3/uL (ref 0.0–0.5)
Eosinophils Relative: 0 %
HCT: 45.7 % (ref 39.0–52.0)
Hemoglobin: 15.9 g/dL (ref 13.0–17.0)
Immature Granulocytes: 2 %
Lymphocytes Relative: 25 %
Lymphs Abs: 2.4 10*3/uL (ref 0.7–4.0)
MCH: 28.9 pg (ref 26.0–34.0)
MCHC: 34.8 g/dL (ref 30.0–36.0)
MCV: 83.1 fL (ref 80.0–100.0)
Monocytes Absolute: 0.8 10*3/uL (ref 0.1–1.0)
Monocytes Relative: 8 %
Neutro Abs: 6.3 10*3/uL (ref 1.7–7.7)
Neutrophils Relative %: 65 %
Platelets: 361 10*3/uL (ref 150–400)
RBC: 5.5 MIL/uL (ref 4.22–5.81)
RDW: 12.1 % (ref 11.5–15.5)
WBC: 9.7 10*3/uL (ref 4.0–10.5)
nRBC: 0 % (ref 0.0–0.2)

## 2019-05-27 LAB — C-REACTIVE PROTEIN: CRP: 1.7 mg/dL — ABNORMAL HIGH (ref <1.0)

## 2019-05-27 LAB — FIBRIN DERIVATIVES D-DIMER (ARMC ONLY): Fibrin derivatives D-dimer (ARMC): 407.92 ng/mL (FEU) (ref 0.00–499.00)

## 2019-05-27 MED ORDER — DEXAMETHASONE 6 MG PO TABS: 6.0000 mg | ORAL_TABLET | Freq: Four times a day (QID) | ORAL | 0 refills | Status: AC

## 2019-05-27 NOTE — Discharge Summary (Signed)
Physician Discharge Summary  Roy Rowe JME:[id number] DOB: 1995-07-07 DOA: 05/23/2019  PCP: Patient, [name]  Admit date: 05/23/2019 Discharge date: 05/27/2019  Admitted From: Home Disposition: Home  Recommendations for Outpatient Follow-up:  1. Follow up with PCP in 1-2 weeks 2.   Home Health: No Equipment/Devices: No  Discharge Condition: Stable CODE STATUS: Full Diet recommendation: Low-fat  Brief/Interim Summary: Roy Rowe an 24 y.o.malewith medical history significant for morbid obesity, asthma, allergic rhinitis, presented to the hospital because of shortness of breath. He said he had a positive coronavirus test at [facility] on [facility], 2019. He has been feeling weak, tired and feverish at home. His temperature has been above 101 at times. Shortness of breath is usually worse with exertion.   1/13: Remains on 4 L supplemental oxygen.  Reports feeling "a little better"  1/14: Titrated to minimal supplemental oxygen.  Energy level improved.  1/15: Patient has been titrated off supplemental oxygen.  He is ambulating freely around the room.  Energy level improved.  Had a lengthy conversation with the patient about his finding of nonalcoholic fatty liver disease on his abdominal ultrasound.  I also reiterated my recommendations to his mother.  Recommend lifestyle changes and follow-up with PCP with possible referral to hepatology.  Discharge Diagnoses:  Principal Problem:   Pneumonia due to COVID-19 virus Active Problems:   Morbid obesity (HCC)   Acute hypoxemic respiratory failure (HCC)  COVID-19 pneumonia Acute hypoxemic respiratory failure Completed IV remdesivir 5 days in house Will complete additional 5 days of Decadron on discharge On room air and ambulating freely without need for oxygen at time of discharge Post discharge quarantine and isolation recommendations provided the patient Medically stable for discharge  home  Asthma Compensated. Not in exacerbation Outpatient follow-up  Obesity stage III BMI 47.5 Counseled patient Nutrition consult  Elevated LFTs Confirmed nonalcoholic fatty liver disease Discussed findings with patient at bedside and patient's mother via phone Recommend lifestyle changes and PCP follow-up   Discharge Instructions  Discharge Instructions    Diet - low sodium heart healthy   Complete by: As directed    Increase activity slowly   Complete by: As directed    MyChart COVID-19 home monitoring program   Complete by: May 27, 2019    Is the patient willing to use the MyChart Mobile App for home monitoring?: Yes   Temperature monitoring   Complete by: May 27, 2019    After how many days would you like to receive a notification of this patient's flowsheet entries?: 1     Allergies as of 05/27/2019   No Known Allergies     Medication List    TAKE these medications   acetaminophen 325 MG tablet Commonly known as: TYLENOL Take 650 mg by mouth every 6 (six) hours as needed.   dexamethasone 6 MG tablet Commonly known as: Decadron Take 1 tablet (6 mg total) by mouth every 6 (six) hours for 5 days. What changed: when to take this   ondansetron 4 MG disintegrating tablet Commonly known as: Zofran ODT Take 1 tablet (4 mg total) by mouth every 8 (eight) hours as needed.       No Known Allergies  Consultations:  none   Procedures/Studies: DG Chest 1 View  Result Date: 05/19/2019 CLINICAL DATA:  24 year old male with shortness of breath and fatigue. EXAM: CHEST  1 VIEW COMPARISON:  Chest radiograph dated 09/22/2007 FINDINGS: There is borderline cardiomegaly. Left upper lobe and left  lung base streaky densities may represent vascular congestion but concerning for developing infiltrate. Clinical correlation is recommended. There is no pleural effusion or pneumothorax. No acute osseous pathology. IMPRESSION: Vascular congestion versus developing infiltrate in  the left lung. Clinical correlation is recommended. Electronically Signed   By: Anner Crete M.D.   On: 05/19/2019 00:20   CT Angio Chest PE W and/or Wo Contrast  Result Date: 05/19/2019 CLINICAL DATA:  24 year old male with shortness of breath x3 days. Concern for pulmonary embolism. EXAM: CT ANGIOGRAPHY CHEST WITH CONTRAST TECHNIQUE: Multidetector CT imaging of the chest was performed using the standard protocol during bolus administration of intravenous contrast. Multiplanar CT image reconstructions and MIPs were obtained to evaluate the vascular anatomy. CONTRAST:  175mL OMNIPAQUE IOHEXOL 350 MG/ML SOLN COMPARISON:  Chest radiograph dated 05/19/2019. FINDINGS: Cardiovascular: There is no cardiomegaly or pericardial effusion. The thoracic aorta is unremarkable. Evaluation of the pulmonary arteries is very limited due to respiratory motion artifact and suboptimal opacification and timing of the contrast. No large central pulmonary artery embolus identified. Mediastinum/Nodes: No hilar or mediastinal adenopathy. The esophagus and thyroid gland are grossly unremarkable as visualized. No mediastinal fluid collection. Lungs/Pleura: Multiple bilateral confluent and nodular airspace opacities with the largest measuring approximately 3.5 cm in the left upper lobe most consistent with multifocal pneumonia. Septic emboli are less likely but not excluded. Clinical correlation is recommended. There is no pleural effusion or pneumothorax. The central airways are patent. Upper Abdomen: Fatty infiltration of the liver. Musculoskeletal: Mild dextroscoliosis of the thoracic spine. No acute osseous pathology. Review of the MIP images confirms the above findings. IMPRESSION: 1. No CT evidence of central pulmonary artery embolus. 2. Bilateral confluent and nodular airspace opacities most consistent with multifocal pneumonia. Septic emboli are less likely but not excluded. Clinical correlation is recommended. Electronically  Signed   By: Anner Crete M.D.   On: 05/19/2019 01:07   DG Chest Port 1 View  Result Date: 05/23/2019 CLINICAL DATA:  Shortness of breath.  COVID-19 positive EXAM: PORTABLE CHEST 1 VIEW COMPARISON:  May 18, 2019 chest radiograph and chest CT May 19, 2019 FINDINGS: There is airspace opacity in both mid and lower lung zones with an overall slight increase in opacity compared to recent study. No frank consolidation. Heart is upper normal in size with pulmonary vascularity normal. No adenopathy. No bone lesions. IMPRESSION: Patchy airspace opacity bilaterally, with an overall slight increase in opacity. The area of somewhat irregular opacity in the left upper lobe seen on recent CT is not appreciable by radiography; there may be focal clearing in this area of the left upper lobe compared to previous study. Stable cardiac silhouette. No adenopathy. The overall appearance is consistent with atypical organism pneumonia. Electronically Signed   By: Lowella Grip III M.D.   On: 05/23/2019 07:59   US Abdomen Limited RUQ  Result Date: 05/26/2019 CLINICAL DATA:  Elevated liver enzymes EXAM: ULTRASOUND ABDOMEN LIMITED RIGHT UPPER QUADRANT COMPARISON:  None. FINDINGS: Gallbladder: No gallstones or wall thickening visualized. There is no pericholecystic fluid. No sonographic Murphy sign noted by sonographer. Common bile duct: Diameter: 4 mm. No intrahepatic or extrahepatic biliary duct dilatation. Liver: No focal lesion identified. Liver echogenicity is increased diffusely. Portal vein is patent on color Doppler imaging with normal direction of blood flow towards the liver. Other: None. IMPRESSION: Diffuse increase in liver echogenicity, a finding indicative of hepatic steatosis. No focal liver lesions are evident; it must be cautioned that the sensitivity of ultrasound for detection of  focal lesions is diminished in this circumstance. Study otherwise unremarkable. Electronically Signed   By: Bretta Bang III M.D.   On: 05/26/2019 08:44    (Echo, Carotid, EGD, Colonoscopy, ERCP)    Subjective: Seen and examined on the day of discharge No acute events over interval Energy level improved On room air Medically stable for discharge home  Discharge Exam: Vitals:   05/27/19 0823 05/27/19 0900  BP: (!) 148/76   Pulse: (!) 109 (!) 107  Resp: 14 19  Temp: 98.2 F (36.8 C)   SpO2: 91% 92%   Vitals:   05/26/19 2000 05/26/19 2345 05/27/19 0823 05/27/19 0900  BP:  (!) 153/85 (!) 148/76   Pulse: 91  (!) 109 (!) 107  Resp: (!) 24  14 19   Temp:  98.3 F (36.8 C) 98.2 F (36.8 C)   TempSrc:  Oral Oral   SpO2: 93%  91% 92%  Weight:      Height:        General: Pt is alert, awake, not in acute distress Cardiovascular: RRR, S1/S2 +, no rubs, no gallops Respiratory: CTA bilaterally, no wheezing, no rhonchi Abdominal: Soft, NT, ND, bowel sounds +, obese Extremities: no edema, no cyanosis    The results of significant diagnostics from this hospitalization (including imaging, microbiology, ancillary and laboratory) are listed below for reference.     Microbiology: Recent Results (from the past 240 hour(s))  Blood Culture (routine x 2)     Status: None (Preliminary result)   Collection Time: 05/23/19  7:30 AM   Specimen: BLOOD  Result Value Ref Range Status   Specimen Description BLOOD BLOOD RIGHT ARM  Final   Special Requests   Final    BOTTLES DRAWN AEROBIC AND ANAEROBIC Blood Culture adequate volume   Culture   Final    NO GROWTH 4 DAYS Performed at [facility], [address], [place], [place]    Report Status PENDING  Incomplete  Blood Culture (routine x 2)     Status: None (Preliminary result)   Collection Time: 05/23/19  7:30 AM   Specimen: BLOOD  Result Value Ref Range Status   Specimen Description BLOOD RIGHT ANTECUBITAL  Final   Special Requests   Final    BOTTLES DRAWN AEROBIC AND ANAEROBIC Blood Culture adequate volume   Culture    Final    NO GROWTH 4 DAYS Performed at [facility], [address] [place], [place]    Report Status PENDING  Incomplete     Labs: BNP (last 3 results) No results for input(s): BNP in the last 8760 hours. Basic Metabolic Panel: Recent Labs  Lab 05/23/19 0730 05/24/19 0407 05/26/19 0718 05/27/19 0411  NA 132* 136 140 139  K 3.2* 3.7 3.9 3.9  CL 97* 99 100 103  CO2 21* 25 29 28   GLUCOSE 135* 137* 115* 120*  BUN 15 17 20  23*  CREATININE 0.91 0.74 0.71 0.75  CALCIUM 8.4* 8.7* 9.1 8.8*   Liver Function Tests: Recent Labs  Lab 05/23/19 0730 05/24/19 0407 05/26/19 0718 05/27/19 0411  AST 194* 171* 117* 153*  ALT 260* 337* 319* 409*  ALKPHOS 65 70 78 76  BILITOT 2.1* 1.3* 1.1 1.1  PROT 8.3* 7.7 7.8 7.3  ALBUMIN 3.7 3.4* 3.3* 3.2*   No results for input(s): LIPASE, AMYLASE in the last 168 hours. No results for input(s): AMMONIA in the last 168 hours. CBC: Recent Labs  Lab 05/23/19 0730 05/24/19 0407 05/26/19  8182 05/27/19 0411  WBC 10.7* 9.7 9.3 9.7  NEUTROABS 8.9* 7.6 6.4 6.3  HGB 16.2 15.3 16.8 15.9  HCT 46.4 45.0 48.8 45.7  MCV 82.4 83.0 83.7 83.1  PLT 243 261 368 361   Cardiac Enzymes: No results for input(s): CKTOTAL, CKMB, CKMBINDEX, TROPONINI in the last 168 hours. BNP: Invalid input(s): POCBNP CBG: No results for input(s): GLUCAP in the last 168 hours. D-Dimer No results for input(s): DDIMER in the last 72 hours. Hgb A1c No results for input(s): HGBA1C in the last 72 hours. Lipid Profile No results for input(s): CHOL, HDL, LDLCALC, TRIG, CHOLHDL, LDLDIRECT in the last 72 hours. Thyroid function studies No results for input(s): TSH, T4TOTAL, T3FREE, THYROIDAB in the last 72 hours.  Invalid input(s): FREET3 Anemia work up No results for input(s): VITAMINB12, FOLATE, FERRITIN, TIBC, IRON, RETICCTPCT in the last 72 hours. Urinalysis No results found for: COLORURINE, APPEARANCEUR, LABSPEC, PHURINE, GLUCOSEU, HGBUR,  BILIRUBINUR, KETONESUR, PROTEINUR, UROBILINOGEN, NITRITE, LEUKOCYTESUR Sepsis Labs Invalid input(s): PROCALCITONIN,  WBC,  LACTICIDVEN Microbiology Recent Results (from the past 240 hour(s))  Blood Culture (routine x 2)     Status: None (Preliminary result)   Collection Time: 05/23/19  7:30 AM   Specimen: BLOOD  Result Value Ref Range Status   Specimen Description BLOOD BLOOD RIGHT ARM  Final   Special Requests   Final    BOTTLES DRAWN AEROBIC AND ANAEROBIC Blood Culture adequate volume   Culture   Final    NO GROWTH 4 DAYS Performed at [facility], [address] [place], [place] [address]    Report Status PENDING  Incomplete  Blood Culture (routine x 2)     Status: None (Preliminary result)   Collection Time: 05/23/19  7:30 AM   Specimen: BLOOD  Result Value Ref Range Status   Specimen Description BLOOD RIGHT ANTECUBITAL  Final   Special Requests   Final    BOTTLES DRAWN AEROBIC AND ANAEROBIC Blood Culture adequate volume   Culture   Final    NO GROWTH 4 DAYS Performed at [facility], [address] [place], [place] [address]    Report Status PENDING  Incomplete     Time coordinating discharge: Over 30 minutes  SIGNED:   Tresa Moore, MD  [facility] 05/27/2019, 5:37 PM Pager   If 7PM-7AM, please contact night-coverage [email or web address] Password [email or web address]

## 2019-05-27 NOTE — Clinical Social Work Note (Addendum)
Patient has orders to discharge home today. Tried calling in room to discuss getting setting up with a PCP. Phone busy. Will try again later.  Charlynn Court, CSW [phone]  11:06 am Tried calling patient in room numerous times and line is still busy. Tried calling his mobile phone. No answer, voicemail not set up. Sent message to RN to see if she could find out if he wants to be set up with a new PCP and if he had preference.  Charlynn Court, CSW [phone]

## 2019-05-28 LAB — CULTURE, BLOOD (ROUTINE X 2)
Culture: NO GROWTH
Culture: NO GROWTH
Special Requests: ADEQUATE
Special Requests: ADEQUATE

## 2020-06-15 ENCOUNTER — Encounter: Payer: Self-pay | Admitting: Emergency Medicine

## 2020-06-15 ENCOUNTER — Other Ambulatory Visit: Payer: Self-pay

## 2020-06-15 ENCOUNTER — Ambulatory Visit
Admission: EM | Admit: 2020-06-15 | Discharge: 2020-06-15 | Disposition: A | Discharge status: Home / Self Care | Payer: 59 | Attending: Family Medicine | Admitting: Family Medicine

## 2020-06-15 DIAGNOSIS — H5789 Other specified disorders of eye and adnexa: Secondary | ICD-10-CM

## 2020-06-15 DIAGNOSIS — H1031 Unspecified acute conjunctivitis, right eye: Secondary | ICD-10-CM

## 2020-06-15 DIAGNOSIS — R5383 Other fatigue: Secondary | ICD-10-CM

## 2020-06-15 MED ORDER — TOBRAMYCIN 0.3 % OP SOLN: 1.0000 [drp] | OPHTHALMIC | 0 refills | Status: DC

## 2020-06-15 NOTE — ED Provider Notes (Addendum)
[facility]   [id number] 06/15/20 Arrival Time: 0959  CC: EYE REDNESS  SUBJECTIVE:  Roy Rowe is a 25 y.o. male who presents with complaint of eye redness that began about 2 days ago.  Reports that he had a virtual visit and was given Polytrim drops.  Reports that these are not doing him any good.  Reports that eyes sometimes tearing draining clear fluid.  States that sometimes there is pain with light.  Denies painful eye movements.  Denies changes in vision.  Denies a precipitating event, trauma, or close contacts with similar symptoms. Denies similar symptoms in the past. Denies fever, chills, nausea, vomiting, eye pain, painful eye movements, halos, discharge, itching, vision changes, double vision, FB sensation, periorbital erythema. Denies contact lens use.    Requesting thyroid labs today as mother has thyroid disease and when she was seen in ER, per patient that she was told to have her kids tested for thyroid disease.  ROS: As per HPI.  All other pertinent ROS negative.     Past Medical History:  Diagnosis Date  . Allergic rhinitis   . Asthma   . Obesity (BMI 30-39.9)    Past Surgical History:  Procedure Laterality Date  . ADENOIDECTOMY    . TONSILLECTOMY     No Known Allergies No current facility-administered medications on file prior to encounter.   Current Outpatient Medications on File Prior to Encounter  Medication Sig Dispense Refill  . acetaminophen (TYLENOL) 325 MG tablet Take 650 mg by mouth every 6 (six) hours as needed.    . ondansetron (ZOFRAN ODT) 4 MG disintegrating tablet Take 1 tablet (4 mg total) by mouth every 8 (eight) hours as needed. 20 tablet 0   Social History   Socioeconomic History  . Marital status: Single    Spouse name: Not on file  . Number of children: Not on file  . Years of education: Not on file  . Highest education level: Not on file  Occupational History  . Not on file  Tobacco Use  . Smoking status: Passive Smoke  Exposure - Never Smoker  . Smokeless tobacco: Never Used  Vaping Use  . Vaping Use: Never used  Substance and Sexual Activity  . Alcohol use: No  . Drug use: No  . Sexual activity: Never  Other Topics Concern  . Not on file  Social History Narrative  . Not on file   Social Determinants of Health   Financial Resource Strain: Not on file  Food Insecurity: Not on file  Transportation Needs: Not on file  Physical Activity: Not on file  Stress: Not on file  Social Connections: Not on file  Intimate Partner Violence: Not on file   Family History  Problem Relation Age of Onset  . Allergic rhinitis Brother   . Asthma Brother     OBJECTIVE:    Visual Acuity  Right Eye Distance: 40 Left Eye Distance: 30 Bilateral Distance: 40  Right Eye Near: R Near: 20 Left Eye Near:  L Near: 20 Bilateral Near:  20   Vitals:   06/15/20 1013 06/15/20 1014  BP: (!) 161/93   Pulse: (!) 117   Resp: 20   Temp: 98.9 F (37.2 C)   TempSrc: Oral   SpO2: 94%   Weight:  (!) 430 lb (195 kg)    General appearance: alert; no distress Eyes: Conjunctivitis to the right eye PERRL; EOMI without discomfort;  no obvious drainage; lid everted without obvious FB; no obvious  fluorescein uptake  Neck: supple Lungs: clear to auscultation bilaterally Heart: regular rate and rhythm Skin: warm and dry Psychological: alert and cooperative; normal mood and affect   ASSESSMENT & PLAN:  1. Redness of right eye   2. Other fatigue   3. Acute bacterial conjunctivitis of right eye     Meds ordered this encounter  Medications  . tobramycin (TOBREX) 0.3 % ophthalmic solution    Sig: Place 1 drop into the right eye every 4 (four) hours.    Dispense:  5 mL    Refill:  0    Order Specific Question:   Supervising Provider    Answer:   Merrilee Jansky [[id number]]     Conjunctivitis Current drops Prescribed Tobrex Use eye drops as prescribed and to completion Dispose of old contacts and wear glasses  until you have finished course of antibiotic eye drops Wash pillow cases, wash hands regularly with soap and water, avoid touching your face and eyes, wash door handles, light switches, remotes and other objects you frequently touch Return or follow up with PCP if symptoms persists such as fever, chills, redness, swelling, eye pain, painful eye movements, vision changes  TSH, T4 pending We will be in touch with abnormal results and treat as needed Get set up with primary care  Reviewed expectations re: course of current medical issues. Questions answered. Outlined signs and symptoms indicating need for more acute intervention. Patient verbalized understanding. After Visit Summary given.   Moshe Cipro, NP 06/17/20 4650    Moshe Cipro, NP 06/17/20 [phone]

## 2020-06-15 NOTE — Discharge Instructions (Addendum)
Prescribed Tobrex drops one drop to the right eye every 4 hours  If symptoms are not improving, follow up with your Eye Doctor  TSH labs pending, will inform of abnormal results as needed  Get set up with primary care as soon as possible  Follow up with this office or with primary care if symptoms are persisting.  Follow up in the ER for high fever, trouble swallowing, trouble breathing, other concerning symptoms.

## 2020-06-15 NOTE — ED Triage Notes (Signed)
Right eye is pink and draining.  Pt states that it throbs sometimes.  He has completed a online visit and is still using the eye drops 3 times a day.

## 2020-06-16 LAB — T4: T4, Total: 10.8 ug/dL (ref 4.5–12.0)

## 2020-06-16 LAB — TSH: TSH: 3.52 u[IU]/mL (ref 0.450–4.500)

## 2020-07-08 ENCOUNTER — Other Ambulatory Visit: Payer: Self-pay

## 2020-07-08 ENCOUNTER — Encounter (HOSPITAL_COMMUNITY): Payer: Self-pay | Admitting: Emergency Medicine

## 2020-07-08 ENCOUNTER — Emergency Department (HOSPITAL_COMMUNITY)
Admission: EM | Admit: 2020-07-08 | Discharge: 2020-07-08 | Disposition: A | Discharge status: Home / Self Care | Payer: Self-pay | Attending: Emergency Medicine | Admitting: Emergency Medicine

## 2020-07-08 DIAGNOSIS — J45909 Unspecified asthma, uncomplicated: Secondary | ICD-10-CM | POA: Insufficient documentation

## 2020-07-08 DIAGNOSIS — H43391 Other vitreous opacities, right eye: Secondary | ICD-10-CM

## 2020-07-08 DIAGNOSIS — H43399 Other vitreous opacities, unspecified eye: Secondary | ICD-10-CM | POA: Insufficient documentation

## 2020-07-08 DIAGNOSIS — Z8616 Personal history of COVID-19: Secondary | ICD-10-CM | POA: Insufficient documentation

## 2020-07-08 DIAGNOSIS — Z7722 Contact with and (suspected) exposure to environmental tobacco smoke (acute) (chronic): Secondary | ICD-10-CM | POA: Insufficient documentation

## 2020-07-08 MED ORDER — FLUORESCEIN SODIUM 1 MG OP STRP
1.0000 | ORAL_STRIP | Freq: Once | OPHTHALMIC | Status: AC
  Administered 2020-07-08: 1 via OPHTHALMIC

## 2020-07-08 MED ORDER — TETRACAINE HCL 0.5 % OP SOLN
2.0000 [drp] | Freq: Once | OPHTHALMIC | Status: AC
  Administered 2020-07-08: 2 [drp] via OPHTHALMIC
  Filled 2020-07-08: qty 4

## 2020-07-08 NOTE — ED Provider Notes (Signed)
[facility] EMERGENCY DEPARTMENT Provider Note   CSN: [id number] Arrival date & time: 07/08/20  1200     History Chief Complaint  Patient presents with  . Eye Problem    Roy Rowe is a 25 y.o. male.  HPI Patient is a 25 year old male with a history of allergic rhinitis, blepharitis of the left eye, hordeolum externum, conjunctivitis, who presents the emergency department due to right eye visual changes.  Patient states about 6 weeks ago he was diagnosed with right eye bacterial conjunctivitis.  He states he took Tobrex which provided relief of his symptoms.  Since his symptoms began, though, he does note that he has had some mild blurry vision in the right eye which he feels has persisted.  He also reports more persistent floaters in the right eye.  No eye pain or eye drainage.  No rhinorrhea, ear pain, URI symptoms.  No other complaints at this time.  Patient states he wears prescription glasses but does not wear contacts.  He attempted to reach out to his optometrist who could not see him for about 1 month, so he came to the emergency department for evaluation.    Past Medical History:  Diagnosis Date  . Allergic rhinitis   . Asthma   . Obesity (BMI 30-39.9)     Patient Active Problem List   Diagnosis Date Noted  . Pneumonia due to COVID-19 virus 05/23/2019  . Acute hypoxemic respiratory failure (HCC) 05/23/2019  . Blepharitis of left eye 03/24/2013  . Hordeolum externum 03/22/2013  . Morbid obesity (HCC) 02/01/2013  . Allergic rhinitis 02/01/2013    Past Surgical History:  Procedure Laterality Date  . ADENOIDECTOMY    . TONSILLECTOMY         Family History  Problem Relation Age of Onset  . Allergic rhinitis Brother   . Asthma Brother     Social History   Tobacco Use  . Smoking status: Passive Smoke Exposure - Never Smoker  . Smokeless tobacco: Never Used  Vaping Use  . Vaping Use: Never used  Substance Use Topics  . Alcohol use: Not Currently     Comment: rarely  . Drug use: No    Home Medications Prior to Admission medications   Medication Sig Start Date End Date Taking? Authorizing Provider  acetaminophen (TYLENOL) 325 MG tablet Take 650 mg by mouth every 6 (six) hours as needed.    [provider]  ondansetron (ZOFRAN ODT) 4 MG disintegrating tablet Take 1 tablet (4 mg total) by mouth every 8 (eight) hours as needed. 05/19/19   Don Perking, [place], MD  tobramycin (TOBREX) 0.3 % ophthalmic solution Place 1 drop into the right eye every 4 (four) hours. 06/15/20   Moshe Cipro, NP    Allergies    Pistachio nut (diagnostic)  Review of Systems   Review of Systems  All other systems reviewed and are negative. Ten systems reviewed and are negative for acute change, except as noted in the HPI.   Physical Exam Updated Vital Signs BP (!) 157/108 (BP Location: Right Wrist)   Pulse (!) 116   Temp 98.3 F (36.8 C) (Oral)   Resp 18   SpO2 92%   Physical Exam Vitals and nursing note reviewed.  Constitutional:      General: He is not in acute distress.    Appearance: He is well-developed.  HENT:     Head: Normocephalic and atraumatic.     Right Ear: External ear normal.     Left  Ear: External ear normal.  Eyes:     General: No scleral icterus.       Right eye: No discharge.        Left eye: No discharge.     Extraocular Movements: Extraocular movements intact.     Conjunctiva/sclera: Conjunctivae normal.     Pupils: Pupils are equal, round, and reactive to light.     Comments: Pupils are equal, round, and reactive to light.  Extraocular movements are intact.  Mild injection noted in both eyes.  No discharge.  No pain with extraocular movements.  Visual fields intact.  Slit-lamp exam: Anterior chambers clear.  No cells and flare.  Slit lamp exam: No increased fluorescein uptake.  No signs of corneal abrasion.    Visual Acuity  Right Eye Distance: 20/50 (with glasses) Left Eye Distance: 20/50 (with  glasses) Bilateral Distance: 20/50 (with glasses)     Neck:     Trachea: No tracheal deviation.  Cardiovascular:     Rate and Rhythm: Normal rate.  Pulmonary:     Effort: Pulmonary effort is normal. No respiratory distress.     Breath sounds: No stridor.  Abdominal:     General: There is no distension.  Musculoskeletal:        General: No swelling or deformity.     Cervical back: Neck supple.  Skin:    General: Skin is warm and dry.     Findings: No rash.  Neurological:     Mental Status: He is alert.     Cranial Nerves: Cranial nerve deficit: no gross deficits.    ED Results / Procedures / Treatments   Labs (all labs ordered are listed, but only abnormal results are displayed) Labs Reviewed - No data to display  EKG None  Radiology No results found.  Procedures Procedures   Medications Ordered in ED Medications  tetracaine (PONTOCAINE) 0.5 % ophthalmic solution 2 drop (2 drops Right Eye Given 07/08/20 1450)  fluorescein ophthalmic strip 1 strip (1 strip Right Eye Given 07/08/20 1450)    ED Course  I have reviewed the triage vital signs and the nursing notes.  Pertinent labs & imaging results that were available during my care of the patient were reviewed by me and considered in my medical decision making (see chart for details).    MDM Rules/Calculators/A&P                          Patient is a 25 year old male who presents to the emergency department with complaints of right eye visual changes for the past month that began worsening a few days ago.  States it started after experiencing bacterial conjunctivitis about 1 month ago.  He took tobramycin which alleviated his bacterial conjunctivitis but his mild blurry vision persisted particularly noting more floaters in the right eye.  Visual acuity is reassuring.  No decrease in vision in the right eye.  Please see findings above.  I additionally performed a slit lamp exam as well as a fluorescein stain and both of  these were reassuring as well.  Anterior chamber is clear.  No cells and flare.  No increase of fluorescein uptake.  No signs of corneal abrasion.  No pain with extraocular movements.  No surrounding skin changes.  No signs of periorbital cellulitis.  Patient tachycardic in the emergency department but states that he is quite anxious.  He seems reassured regarding the findings today.  Recommended that he follow-up with his optometrist  for a reevaluation.  Discussed return precautions.  His questions were answered and he was amicable at the time of discharge.  Final Clinical Impression(s) / ED Diagnoses Final diagnoses:  Blurry vision, right eye    Rx / DC Orders ED Discharge Orders    None       Placido Sou, PA-C 07/08/20 1528    Bethann Berkshire, MD 07/09/20 1727

## 2020-07-08 NOTE — Discharge Instructions (Signed)
Please follow-up with your optometrist as soon as possible to schedule an appointment for reevaluation.  If your symptoms worsen, please return to the emergency department for reevaluation.  It was a pleasure to meet you.

## 2020-07-08 NOTE — ED Triage Notes (Signed)
Patient c/o vision changes in right eye x2-3 days. Per patient has had blurred vision in right eye since being diagnosed with conjunctivitis in January 18. Patient started seeing floaters x2 -3 days ago. Denies any eye pain.

## 2020-07-15 ENCOUNTER — Encounter: Payer: Self-pay | Admitting: Emergency Medicine

## 2020-07-15 ENCOUNTER — Ambulatory Visit
Admission: EM | Admit: 2020-07-15 | Discharge: 2020-07-15 | Disposition: A | Discharge status: Home / Self Care | Payer: Self-pay | Attending: Family Medicine | Admitting: Family Medicine

## 2020-07-15 DIAGNOSIS — H5789 Other specified disorders of eye and adnexa: Secondary | ICD-10-CM

## 2020-07-15 DIAGNOSIS — H43391 Other vitreous opacities, right eye: Secondary | ICD-10-CM

## 2020-07-15 MED ORDER — OLOPATADINE HCL 0.1 % OP SOLN: 1.0000 [drp] | Freq: Two times a day (BID) | OPHTHALMIC | 0 refills | Status: DC

## 2020-07-15 NOTE — ED Provider Notes (Signed)
[facility]    CSN: [id number] Arrival date & time: 07/15/20  3491      History   Chief Complaint No chief complaint on file.   HPI Roy Rowe is a 25 y.o. male.   HPI  Patient returns today with right redness and drainage.  Patient has been seen for this problem over a month ago when he was treated with tobramycin eyedrops which did not completely resolve problem.  He subsequently developed visual floaters present only in the right eye presented over a week ago to the emergency department.  No treatment was rendered patient was advised at that time he needed to follow-up with an ophthalmologist.  Patient reports today that he has no insurance and is unable to pay out-of-pocket expense to see a ophthalmologist.  He is currently having eye pressure and discomfort along with increasing redness of the right eye.  He continues to see floaters on the right.  Past Medical History:  Diagnosis Date  . Allergic rhinitis   . Asthma   . Obesity (BMI 30-39.9)     Patient Active Problem List   Diagnosis Date Noted  . Pneumonia due to COVID-19 virus 05/23/2019  . Acute hypoxemic respiratory failure (HCC) 05/23/2019  . Blepharitis of left eye 03/24/2013  . Hordeolum externum 03/22/2013  . Morbid obesity (HCC) 02/01/2013  . Allergic rhinitis 02/01/2013    Past Surgical History:  Procedure Laterality Date  . ADENOIDECTOMY    . TONSILLECTOMY         Home Medications    Prior to Admission medications   Medication Sig Start Date End Date Taking? Authorizing Provider  olopatadine (PATANOL) 0.1 % ophthalmic solution Place 1 drop into the right eye 2 (two) times daily. 07/15/20  Yes Bing Neighbors, FNP  acetaminophen (TYLENOL) 325 MG tablet Take 650 mg by mouth every 6 (six) hours as needed.    [provider]  ondansetron (ZOFRAN ODT) 4 MG disintegrating tablet Take 1 tablet (4 mg total) by mouth every 8 (eight) hours as needed. 05/19/19   Don Perking, [place], MD   tobramycin (TOBREX) 0.3 % ophthalmic solution Place 1 drop into the right eye every 4 (four) hours. 06/15/20   Moshe Cipro, NP    Family History Family History  Problem Relation Age of Onset  . Allergic rhinitis Brother   . Asthma Brother     Social History Social History   Tobacco Use  . Smoking status: Passive Smoke Exposure - Never Smoker  . Smokeless tobacco: Never Used  Vaping Use  . Vaping Use: Never used  Substance Use Topics  . Alcohol use: Not Currently    Comment: rarely  . Drug use: No     Allergies   Pistachio nut (diagnostic)   Review of Systems Review of Systems Pertinent negatives listed in HPI  Physical Exam Triage Vital Signs ED Triage Vitals  Enc Vitals Group     BP 07/15/20 0940 (!) 162/103     Pulse Rate 07/15/20 0940 (!) 113     Resp 07/15/20 0940 17     Temp 07/15/20 0940 98.8 F (37.1 C)     Temp Source 07/15/20 0940 Oral     SpO2 07/15/20 0940 98 %     Weight --      Height --      Head Circumference --      Peak Flow --      Pain Score 07/15/20 0946 8     Pain Loc --  Pain Edu? --      Excl. in GC? --    No data found.  Updated Vital Signs BP (!) 151/101 (BP Location: Right Arm) Comment: states he does get nervous while out of the house.  Pulse (!) 113   Temp 98.8 F (37.1 C) (Oral)   Resp 17   SpO2 98%   Visual Acuity Right Eye Distance:   Left Eye Distance:   Bilateral Distance:    Right Eye Near:   Left Eye Near:    Bilateral Near:     Physical Exam Vitals reviewed.  Constitutional:      Appearance: He is obese. He is not toxic-appearing.  HENT:     Head: Atraumatic.  Eyes:     Conjunctiva/sclera:     Right eye: Right conjunctiva is injected. Hemorrhage present.     Left eye: Left conjunctiva is not injected. No chemosis or exudate.    Comments: Right eye clear persistent drainage   Cardiovascular:     Rate and Rhythm: Tachycardia present.  Pulmonary:     Effort: Pulmonary effort is normal.      Breath sounds: Normal breath sounds and air entry.  Neurological:     General: No focal deficit present.     Mental Status: He is alert and oriented to person, place, and time. Mental status is at baseline.      UC Treatments / Results  Labs (all labs ordered are listed, but only abnormal results are displayed) Labs Reviewed - No data to display  EKG   Radiology No results found.  Procedures Procedures (including critical care time)  Medications Ordered in UC Medications - No data to display  Initial Impression / Assessment and Plan / UC Course  I have reviewed the triage vital signs and the nursing notes.  Pertinent labs & imaging results that were available during my care of the patient were reviewed by me and considered in my medical decision making (see chart for details).    Eye redness and right eye floaters of unknown etiology. Patanol for eye dryness.  Follow-up immediately with ophthalmology for evaluation of source of eye problem. Provided information to follow-up and schedule with ophthalmology. Final Clinical Impressions(s) / UC Diagnoses   Final diagnoses:  Redness of right eye  Floaters, right   Discharge Instructions   None    ED Prescriptions    Medication Sig Dispense Auth. Provider   olopatadine (PATANOL) 0.1 % ophthalmic solution Place 1 drop into the right eye 2 (two) times daily. 5 mL Bing Neighbors, FNP     PDMP not reviewed this encounter.   Bing Neighbors, FNP 07/16/20 2231

## 2020-07-15 NOTE — ED Triage Notes (Signed)
States he is having eye floaters in that eye and some drainage that just started recently.

## 2020-07-15 NOTE — ED Triage Notes (Signed)
Was placed on steroid eye drop in January.  His right eye got better.  States he has finished the eye drop and now his eye is red and painful.

## 2020-08-24 DIAGNOSIS — H2513 Age-related nuclear cataract, bilateral: Secondary | ICD-10-CM | POA: Insufficient documentation

## 2020-08-24 DIAGNOSIS — H3581 Retinal edema: Secondary | ICD-10-CM | POA: Insufficient documentation

## 2020-08-24 DIAGNOSIS — H44113 Panuveitis, bilateral: Secondary | ICD-10-CM | POA: Insufficient documentation

## 2020-08-24 DIAGNOSIS — H21543 Posterior synechiae (iris), bilateral: Secondary | ICD-10-CM | POA: Insufficient documentation

## 2020-10-29 ENCOUNTER — Emergency Department (HOSPITAL_COMMUNITY)
Admission: EM | Admit: 2020-10-29 | Discharge: 2020-10-29 | Disposition: A | Discharge status: Home / Self Care | Payer: Self-pay | Attending: Emergency Medicine | Admitting: Emergency Medicine

## 2020-10-29 ENCOUNTER — Emergency Department (HOSPITAL_COMMUNITY): Payer: Self-pay

## 2020-10-29 ENCOUNTER — Other Ambulatory Visit: Payer: Self-pay

## 2020-10-29 ENCOUNTER — Encounter (HOSPITAL_COMMUNITY): Payer: Self-pay | Admitting: Emergency Medicine

## 2020-10-29 DIAGNOSIS — D869 Sarcoidosis, unspecified: Secondary | ICD-10-CM

## 2020-10-29 DIAGNOSIS — R0602 Shortness of breath: Secondary | ICD-10-CM

## 2020-10-29 DIAGNOSIS — R000 Tachycardia, unspecified: Secondary | ICD-10-CM | POA: Insufficient documentation

## 2020-10-29 DIAGNOSIS — R059 Cough, unspecified: Secondary | ICD-10-CM

## 2020-10-29 DIAGNOSIS — J45909 Unspecified asthma, uncomplicated: Secondary | ICD-10-CM | POA: Insufficient documentation

## 2020-10-29 HISTORY — DX: Sarcoidosis, unspecified: D86.9

## 2020-10-29 MED ORDER — ALBUTEROL SULFATE HFA 108 (90 BASE) MCG/ACT IN AERS
2.0000 | INHALATION_SPRAY | RESPIRATORY_TRACT | Status: DC | PRN
  Administered 2020-10-29: 2 via RESPIRATORY_TRACT
  Filled 2020-10-29: qty 6.7

## 2020-10-29 NOTE — ED Triage Notes (Signed)
Pt with c/o SOB that started an hour ago. States he has hx of Asthma and Sarcoidosis. Pt states he tried his inhaler but that it did not help.

## 2020-10-29 NOTE — ED Provider Notes (Signed)
[facility] Emergency Department Provider Note MRN:  [id number]  Arrival date & time: 10/29/20     Chief Complaint   Shortness of Breath   History of Present Illness   Roy Rowe is a 25 y.o. year-old male with a history of obesity, sarcoidosis, asthma presenting to the ED with chief complaint of shortness of breath.  Patient explains his symptoms began earlier this afternoon with a coughing spell that would not go away.  Explains that he was recently taken off of his chronic prednisone.  He was taking 20 mg a day and he was told to go off of it completely.  He thinks this is too quickly.  He continued to have cough and shortness of breath and so he took 40 mg of prednisone.  Currently he says he feels much better, no longer coughing, no longer short of breath.  He denies any chest pain, no recent leg pain or swelling.  No other complaints at this time.  Symptoms were constant, moderate, worse with coughing.  Review of Systems  A complete 10 system review of systems was obtained and all systems are negative except as noted in the HPI and PMH.   Patient's Health History    Past Medical History:  Diagnosis Date   Allergic rhinitis    Asthma    Obesity (BMI 30-39.9)    Sarcoidosis     Past Surgical History:  Procedure Laterality Date   ADENOIDECTOMY     TONSILLECTOMY      Family History  Problem Relation Age of Onset   Allergic rhinitis Brother    Asthma Brother     Social History   Socioeconomic History   Marital status: Single    Spouse name: Not on file   Number of children: Not on file   Years of education: Not on file   Highest education level: Not on file  Occupational History   Not on file  Tobacco Use   Smoking status: Never    Passive exposure: Yes   Smokeless tobacco: Never  Vaping Use   Vaping Use: Never used  Substance and Sexual Activity   Alcohol use: Not Currently    Comment: rarely   Drug use: No   Sexual activity: Never   Other Topics Concern   Not on file  Social History Narrative   Not on file   Social Determinants of Health   Financial Resource Strain: Not on file  Food Insecurity: Not on file  Transportation Needs: Not on file  Physical Activity: Not on file  Stress: Not on file  Social Connections: Not on file  Intimate Partner Violence: Not on file     Physical Exam   Vitals:   10/29/20 2125  BP: (!) 160/123  Pulse: (!) 118  Resp: 20  Temp: 98 F (36.7 C)  SpO2: 95%    CONSTITUTIONAL: Well-appearing, NAD NEURO:  Alert and oriented x 3, no focal deficits EYES:  eyes equal and reactive ENT/NECK:  no LAD, no JVD CARDIO: Regular rate, well-perfused, normal S1 and S2 PULM:  CTAB no wheezing or rhonchi GI/GU:  normal bowel sounds, non-distended, non-tender MSK/SPINE:  No gross deformities, no edema SKIN:  no rash, atraumatic PSYCH:  Appropriate speech and behavior  *Additional and/or pertinent findings included in MDM below  Diagnostic and Interventional Summary    EKG Interpretation  Date/Time:  Monday October 29 2020 21:30:37 EDT Ventricular Rate:  122 PR Interval:  156 QRS Duration: 78 QT Interval:  298 QTC Calculation: 424 R Axis:   128 Text Interpretation: Sinus tachycardia Right axis deviation Abnormal ECG Confirmed by Kennis Carina [phone]) on 10/29/2020 11:19:51 PM        Labs Reviewed - No data to display  DG Chest Portable 1 View  Final Result      Medications  albuterol (VENTOLIN HFA) 108 (90 Base) MCG/ACT inhaler 2 puff (2 puffs Inhalation Given 10/29/20 2255)     Procedures  /  Critical Care Procedures  ED Course and Medical Decision Making  I have reviewed the triage vital signs, the nursing notes, and pertinent available records from the EMR.  Listed above are laboratory and imaging tests that I personally ordered, reviewed, and interpreted and then considered in my medical decision making (see below for details).  Shortness of breath and cough,  history of sarcoid, history of asthma.  Symptoms are now resolved after taking his home prednisone.  Seems that maybe he was taken off of his prednisone too abruptly.  He has no leg pain or swelling, no evidence of DVT on exam, no chest pain, currently without shortness of breath.  He is mildly tachycardic but explains he has been having resting heart rates between 100 and 115 ever since he recovered from COVID.  This is supported by recent vitals on record.  Currently heart rate is 110, seems to be his new baseline.  He is sitting comfortably with no increased work of breathing, lungs are clear, he is appropriate for discharge, advised follow-up with his regular doctors.       Elmer Sow. Pilar Plate, MD [facility] Emergency Medicine [facility] [email or web address]  Final Clinical Impressions(s) / ED Diagnoses     ICD-10-CM   1. SOB (shortness of breath)  R06.02     2. Cough  R05.9     3. Sarcoidosis  D86.9       ED Discharge Orders     None        Discharge Instructions Discussed with and Provided to Patient:    Discharge Instructions      You were evaluated in the Emergency Department and after careful evaluation, we did not find any emergent condition requiring admission or further testing in the hospital.  Your exam/testing today was overall reassuring.  Symptoms seem explained by your sarcoidosis.  Recommend contacting your doctors and discussing your prednisone dose.  Please return to the Emergency Department if you experience any worsening of your condition.  Thank you for allowing [place] to be a part of your care.        Sabas Sous, MD 10/29/20 [phone]

## 2020-10-29 NOTE — Discharge Instructions (Addendum)
You were evaluated in the Emergency Department and after careful evaluation, we did not find any emergent condition requiring admission or further testing in the hospital.  Your exam/testing today was overall reassuring.  Symptoms seem explained by your sarcoidosis.  Recommend contacting your doctors and discussing your prednisone dose.  Please return to the Emergency Department if you experience any worsening of your condition.  Thank you for allowing [place] to be a part of your care.

## 2020-11-02 ENCOUNTER — Ambulatory Visit
Admission: EM | Admit: 2020-11-02 | Discharge: 2020-11-02 | Disposition: A | Discharge status: Home / Self Care | Payer: Self-pay | Attending: Emergency Medicine | Admitting: Emergency Medicine

## 2020-11-02 ENCOUNTER — Encounter: Payer: Self-pay | Admitting: Emergency Medicine

## 2020-11-02 DIAGNOSIS — R062 Wheezing: Secondary | ICD-10-CM

## 2020-11-02 DIAGNOSIS — R059 Cough, unspecified: Secondary | ICD-10-CM

## 2020-11-02 MED ORDER — DEXAMETHASONE SODIUM PHOSPHATE 10 MG/ML IJ SOLN
10.0000 mg | Freq: Once | INTRAMUSCULAR | Status: AC
  Administered 2020-11-02: 10 mg via INTRAMUSCULAR

## 2020-11-02 NOTE — Discharge Instructions (Addendum)
Steroid shot given in office.  Please go to the ED if symptoms do not improve over the course of the day.  You may benefit from additional imaging or breathing treatments that we are unable to do in UC COVID testing ordered.  It will take between 5-7 days for test results.  Someone will contact you regarding abnormal results.    In the meantime: You should remain isolated in your home for 5 days from symptom onset AND greater than 72 hours after symptoms resolution (absence of fever without the use of fever-reducing medication and improvement in respiratory symptoms), whichever is longer Get plenty of rest and push fluids Continue with prednisone as prescribed Use OTC zyrtec for nasal congestion, runny nose, and/or sore throat Use OTC flonase for nasal congestion and runny nose Use medications daily for symptom relief Use OTC medications like ibuprofen or tylenol as needed fever or pain Call or go to the ED if you have any new or worsening symptoms such as fever, worsening cough, shortness of breath, chest tightness, chest pain, turning blue, changes in mental status, etc..Marland Kitchen

## 2020-11-02 NOTE — ED Provider Notes (Signed)
[facility]   [id number] 11/02/20 Arrival Time: 1006   CC: cough congestion sob wheezing fever  SUBJECTIVE: History from: patient.  Roy Rowe is a 25 y.o. male who presents with fever, productive cough with green sputum, congestion, sob and wheezing x few days.  Denies sick exposure to COVID, flu or strep.  Seen in the ED and treated with 60 mg of prednisone daily for sarcoidosis flare.  Was given albuterol inhaler as well. X-ray showed vascular congestion, but no PNA.  Symptoms are made worse at night.  Reports previous symptoms in the past with covid, flu and sarcoid flare.   Denies fatigue, sinus pain, rhinorrhea, sore throat, chest pain, nausea, changes in bowel or bladder habits.    ROS: As per HPI.  All other pertinent ROS negative.     Past Medical History:  Diagnosis Date   Allergic rhinitis    Asthma    Obesity (BMI 30-39.9)    Sarcoidosis    Past Surgical History:  Procedure Laterality Date   ADENOIDECTOMY     TONSILLECTOMY     Allergies  Allergen Reactions   Pistachio Nut (Diagnostic) Swelling   No current facility-administered medications on file prior to encounter.   Current Outpatient Medications on File Prior to Encounter  Medication Sig Dispense Refill   acetaminophen (TYLENOL) 325 MG tablet Take 650 mg by mouth every 6 (six) hours as needed.     olopatadine (PATANOL) 0.1 % ophthalmic solution Place 1 drop into the right eye 2 (two) times daily. 5 mL 0   tobramycin (TOBREX) 0.3 % ophthalmic solution Place 1 drop into the right eye every 4 (four) hours. 5 mL 0   Social History   Socioeconomic History   Marital status: Single    Spouse name: Not on file   Number of children: Not on file   Years of education: Not on file   Highest education level: Not on file  Occupational History   Not on file  Tobacco Use   Smoking status: Never    Passive exposure: Yes   Smokeless tobacco: Never  Vaping Use   Vaping Use: Never used  Substance and  Sexual Activity   Alcohol use: Not Currently    Comment: rarely   Drug use: No   Sexual activity: Never  Other Topics Concern   Not on file  Social History Narrative   Not on file   Social Determinants of Health   Financial Resource Strain: Not on file  Food Insecurity: Not on file  Transportation Needs: Not on file  Physical Activity: Not on file  Stress: Not on file  Social Connections: Not on file  Intimate Partner Violence: Not on file   Family History  Problem Relation Age of Onset   Allergic rhinitis Brother    Asthma Brother     OBJECTIVE:  Vitals:   11/02/20 1053  BP: 124/80  Pulse: (!) 131  Resp: (!) 25  Temp: 98.9 F (37.2 C)  TempSrc: Oral  SpO2: 92%    General appearance: alert; appears fatigued, but nontoxic; speaking in full sentences and tolerating own secretions, audible wheezes HEENT: NCAT; Ears: EACs clear, TMs pearly gray; Eyes: PERRL.  EOM grossly intact. Nose: nares patent without rhinorrhea, Throat: oropharynx clear, tonsils non erythematous or enlarged, uvula midline  Neck: supple without LAD Lungs: unlabored respirations, symmetrical air entry; cough: moderate; no respiratory distress; CTAB Heart: tachycardia Skin: warm and dry Psychological: alert and cooperative; normal mood and affect  ASSESSMENT &  PLAN:  1. Cough   2. Wheezing     Meds ordered this encounter  Medications   dexamethasone (DECADRON) injection 10 mg    Steroid shot given in office.  Please go to the ED if symptoms do not improve over the course of the day.  You may benefit from additional imaging or breathing treatments that we are unable to do in [facility] COVID testing ordered.  It will take between 5-7 days for test results.  Someone will contact you regarding abnormal results.    In the meantime: You should remain isolated in your home for 5 days from symptom onset AND greater than 72 hours after symptoms resolution (absence of fever without the use of fever-reducing  medication and improvement in respiratory symptoms), whichever is longer Get plenty of rest and push fluids Continue with prednisone as prescribed Use OTC zyrtec for nasal congestion, runny nose, and/or sore throat Use OTC flonase for nasal congestion and runny nose Use medications daily for symptom relief Use OTC medications like ibuprofen or tylenol as needed fever or pain Call or go to the ED if you have any new or worsening symptoms such as fever, worsening cough, shortness of breath, chest tightness, chest pain, turning blue, changes in mental status, etc...   Reviewed expectations re: course of current medical issues. Questions answered. Outlined signs and symptoms indicating need for more acute intervention. Patient verbalized understanding. After Visit Summary given.          Rennis Harding, PA-C 11/02/20 1200

## 2020-11-02 NOTE — ED Triage Notes (Signed)
Coughing up phlegm for past few days, shortness of breath and fever.   Pt is currently taking 60mg  of prednisone a daily. Hx of sacrcoidosis and asthma.

## 2020-11-03 ENCOUNTER — Encounter (HOSPITAL_COMMUNITY): Payer: Self-pay

## 2020-11-03 ENCOUNTER — Other Ambulatory Visit: Payer: Self-pay

## 2020-11-03 ENCOUNTER — Emergency Department (HOSPITAL_COMMUNITY)
Admission: EM | Admit: 2020-11-03 | Discharge: 2020-11-04 | Disposition: A | Discharge status: Home / Self Care | Payer: Self-pay | Attending: Emergency Medicine | Admitting: Emergency Medicine

## 2020-11-03 DIAGNOSIS — R202 Paresthesia of skin: Secondary | ICD-10-CM | POA: Insufficient documentation

## 2020-11-03 DIAGNOSIS — M7989 Other specified soft tissue disorders: Secondary | ICD-10-CM

## 2020-11-03 DIAGNOSIS — J45909 Unspecified asthma, uncomplicated: Secondary | ICD-10-CM | POA: Insufficient documentation

## 2020-11-03 DIAGNOSIS — Z8616 Personal history of COVID-19: Secondary | ICD-10-CM | POA: Insufficient documentation

## 2020-11-03 DIAGNOSIS — R2233 Localized swelling, mass and lump, upper limb, bilateral: Secondary | ICD-10-CM | POA: Insufficient documentation

## 2020-11-03 LAB — I-STAT CHEM 8, ED
BUN: 23 mg/dL — ABNORMAL HIGH (ref 6–20)
Calcium, Ion: 1.25 mmol/L (ref 1.15–1.40)
Chloride: 100 mmol/L (ref 98–111)
Creatinine, Ser: 0.7 mg/dL (ref 0.61–1.24)
Glucose, Bld: 155 mg/dL — ABNORMAL HIGH (ref 70–99)
HCT: 51 % (ref 39.0–52.0)
Hemoglobin: 17.3 g/dL — ABNORMAL HIGH (ref 13.0–17.0)
Potassium: 3.6 mmol/L (ref 3.5–5.1)
Sodium: 137 mmol/L (ref 135–145)
TCO2: 25 mmol/L (ref 22–32)

## 2020-11-03 NOTE — ED Triage Notes (Signed)
Pt reports getting steroid shot in left arm yesterday, shortly after pt reports left forearm started feeling numb. Pt says upper arm and shoulder have normal sensation. Pt able to move LE as normal

## 2020-11-03 NOTE — ED Provider Notes (Signed)
[facility] EMERGENCY DEPARTMENT Provider Note   CSN: [id number] Arrival date & time: 11/03/20  2059     History Chief Complaint  Patient presents with   arm numbness    Numbness after steroid injection    Roy Rowe is a 25 y.o. male.  HPI     This is a 25 year old male with a history of asthma, sarcoidosis, morbid obesity previous COVID-19 infection who presents with bilateral hand tingling and swelling.  Patient reports onset of symptoms several hours prior to arrival.  He states his left hand is worse than his right.  He did receive a steroid shot in his left bicep yesterday.  He denies any shooting pain but states that it feels numb and tingly.  He also feels numbness in his bilateral toes.  He denies being exposed to extreme heat.  He does report some dietary indiscretion today with increased sodium intake.  No known history of blood clots.  He has been on chronic prednisone since January.  He has ongoing respiratory issues but these are unchanged.  No recent fevers.  Past Medical History:  Diagnosis Date   Allergic rhinitis    Asthma    Obesity (BMI 30-39.9)    Sarcoidosis     Patient Active Problem List   Diagnosis Date Noted   Pneumonia due to COVID-19 virus 05/23/2019   Acute hypoxemic respiratory failure (HCC) 05/23/2019   Blepharitis of left eye 03/24/2013   Hordeolum externum 03/22/2013   Morbid obesity (HCC) 02/01/2013   Allergic rhinitis 02/01/2013    Past Surgical History:  Procedure Laterality Date   ADENOIDECTOMY     TONSILLECTOMY         Family History  Problem Relation Age of Onset   Allergic rhinitis Brother    Asthma Brother     Social History   Tobacco Use   Smoking status: Never    Passive exposure: Yes   Smokeless tobacco: Never  Vaping Use   Vaping Use: Never used  Substance Use Topics   Alcohol use: Not Currently    Comment: rarely   Drug use: No    Home Medications Prior to Admission medications   Medication Sig  Start Date End Date Taking? Authorizing Provider  acetaminophen (TYLENOL) 325 MG tablet Take 650 mg by mouth every 6 (six) hours as needed.    [provider]  olopatadine (PATANOL) 0.1 % ophthalmic solution Place 1 drop into the right eye 2 (two) times daily. 07/15/20   Bing Neighbors, FNP  tobramycin (TOBREX) 0.3 % ophthalmic solution Place 1 drop into the right eye every 4 (four) hours. 06/15/20   Moshe Cipro, NP    Allergies    Pistachio nut (diagnostic)  Review of Systems   Review of Systems  Constitutional:  Negative for fever.  Respiratory:  Positive for shortness of breath. Negative for cough.   Cardiovascular:  Negative for chest pain.  Gastrointestinal:  Negative for abdominal pain, nausea and vomiting.  Musculoskeletal:        Bilateral hand swelling  Neurological:  Positive for numbness.  All other systems reviewed and are negative.  Physical Exam Updated Vital Signs BP (!) 174/103   Pulse 93   Temp 98.2 F (36.8 C) (Oral)   Resp 18   Ht 1.905 m (6\' 3" )   Wt (!) 212.7 kg   SpO2 94%   BMI 58.62 kg/m   Physical Exam Vitals and nursing note reviewed.  Constitutional:      Appearance:  He is well-developed. He is obese. He is not ill-appearing.  HENT:     Head: Normocephalic and atraumatic.  Eyes:     Pupils: Pupils are equal, round, and reactive to light.  Cardiovascular:     Rate and Rhythm: Normal rate and regular rhythm.     Heart sounds: Normal heart sounds. No murmur heard.    Comments: 2+ radial pulses bilaterally Pulmonary:     Effort: Pulmonary effort is normal. No respiratory distress.     Breath sounds: Wheezing present.  Abdominal:     General: Bowel sounds are normal.     Palpations: Abdomen is soft.     Tenderness: There is no abdominal tenderness. There is no rebound.  Musculoskeletal:     Cervical back: Neck supple.     Comments: No appreciable asymmetric swelling, no pitting of the hands, exam is limited secondary to body  habitus  Lymphadenopathy:     Cervical: No cervical adenopathy.  Skin:    General: Skin is warm and dry.  Neurological:     Mental Status: He is alert and oriented to person, place, and time.     Comments: Sensation grossly intact  Psychiatric:        Mood and Affect: Mood normal.    ED Results / Procedures / Treatments   Labs (all labs ordered are listed, but only abnormal results are displayed) Labs Reviewed  I-STAT CHEM 8, ED - Abnormal; Notable for the following components:      Result Value   BUN 23 (*)    Glucose, Bld 155 (*)    Hemoglobin 17.3 (*)    All other components within normal limits    EKG None  Radiology No results found.  Procedures Procedures   Medications Ordered in ED Medications - No data to display  ED Course  I have reviewed the triage vital signs and the nursing notes.  Pertinent labs & imaging results that were available during my care of the patient were reviewed by me and considered in my medical decision making (see chart for details).    MDM Rules/Calculators/A&P                          Patient presents with paresthesias and reported swelling of the bilateral upper extremities.  He is overall nontoxic vital signs notable for blood pressure of 174/103.  He is morbidly obese.  Exam is significantly limited secondary to this but I do not appreciate any pitting edema or asymmetric swelling.  Neurologic exam is grossly intact with normal sensation.  Patient does report some dietary indiscretion with increased salt intake.  Especially given recent heat, this could cause fluid shifts and swelling.  I am reassured that the symptoms are bilateral.  Patient does report slight worse on the left than the right but also received a steroid shot yesterday.  I do not appreciate any overlying skin changes or bruising.  He is neurovascular intact.  Numbness could be related to proximity of nerve to injection site.  But have lower suspicion for blood clot  given bilateral nature of symptoms.  Patient does report history of hypokalemia.  Chem-8 was checked and is largely reassuring.  Recommend he monitor his symptoms closely.  Recommend decreasing salt in his diet.  Follow-up with primary physician regarding blood pressure.  After history, exam, and medical workup I feel the patient has been appropriately medically screened and is safe for discharge home. Pertinent diagnoses were  discussed with the patient. Patient was given return precautions.  Final Clinical Impression(s) / ED Diagnoses Final diagnoses:  Swelling of extremity  Paresthesia    Rx / DC Orders ED Discharge Orders     None        Shon Baton, MD 11/03/20 2357

## 2020-11-03 NOTE — Discharge Instructions (Addendum)
You were seen today for numbness in the hands and feet as well as swelling.  Your chemistry testing is reassuring.  Your potassium is normal.  Your blood pressure was elevated today.  Follow-up with your primary doctor regarding this.  Reduce salt in your diet as this can affect fluid shifts and swelling.

## 2020-11-04 ENCOUNTER — Emergency Department (HOSPITAL_COMMUNITY)
Admission: EM | Admit: 2020-11-04 | Discharge: 2020-11-04 | Disposition: A | Discharge status: Home / Self Care | Payer: Self-pay | Attending: Emergency Medicine | Admitting: Emergency Medicine

## 2020-11-04 ENCOUNTER — Emergency Department (HOSPITAL_COMMUNITY): Payer: Self-pay

## 2020-11-04 ENCOUNTER — Emergency Department: Payer: Self-pay

## 2020-11-04 ENCOUNTER — Encounter (HOSPITAL_COMMUNITY): Payer: Self-pay

## 2020-11-04 ENCOUNTER — Other Ambulatory Visit: Payer: Self-pay

## 2020-11-04 DIAGNOSIS — J4541 Moderate persistent asthma with (acute) exacerbation: Secondary | ICD-10-CM | POA: Insufficient documentation

## 2020-11-04 DIAGNOSIS — R0789 Other chest pain: Secondary | ICD-10-CM

## 2020-11-04 DIAGNOSIS — R000 Tachycardia, unspecified: Secondary | ICD-10-CM | POA: Insufficient documentation

## 2020-11-04 LAB — COVID-19, FLU A+B NAA
Influenza A, NAA: NOT DETECTED
Influenza B, NAA: NOT DETECTED
SARS-CoV-2, NAA: NOT DETECTED

## 2020-11-04 LAB — TROPONIN I (HIGH SENSITIVITY)
Troponin I (High Sensitivity): 2 ng/L (ref <18)
Troponin I (High Sensitivity): 2 ng/L (ref <18)

## 2020-11-04 LAB — D-DIMER, QUANTITATIVE: D-Dimer, Quant: 0.31 ug/mL-FEU (ref 0.00–0.50)

## 2020-11-04 MED ORDER — IPRATROPIUM-ALBUTEROL 0.5-2.5 (3) MG/3ML IN SOLN
3.0000 mL | Freq: Once | RESPIRATORY_TRACT | Status: AC
  Administered 2020-11-04: 3 mL via RESPIRATORY_TRACT
  Filled 2020-11-04: qty 3

## 2020-11-04 NOTE — Discharge Instructions (Addendum)
You were seen today for chest tightness and shortness of breath.  This improves with inhalers.  You are already on chronic prednisone at this point.  Continue as directed as an outpatient.  Use your inhaler every 2-4 hours over the next 12 hours to prevent recurrent wheeze.

## 2020-11-04 NOTE — ED Provider Notes (Signed)
[facility] EMERGENCY DEPARTMENT Provider Note   CSN: [id number] Arrival date & time: 11/04/20  0124     History Chief Complaint  Patient presents with   Chest Pain   Dizziness    Roy Rowe is a 25 y.o. male.  HPI     This is a 25 year old male with a history of obesity, sarcoidosis who presents with dizziness and chest pressure.  Patient was seen and evaluated by myself approximately 2 hours ago.  At that time he was having some paresthesias and swelling of the upper extremities.  Patient states that he forgot to mention that he has been having some chest tightness.  He recently has had some shortness of breath and cough.  He is being treated with prednisone and trialed his inhaler at home with minimal relief.  Got home after being discharged and had "a dizzy spell" and felt chest pressure.  He used his inhaler with minimal relief.  Patient states he has these episodes fairly frequently.  No exertional symptoms.  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   Retinal edema 08/24/2020   Pneumonia due to COVID-19 virus 05/23/2019   Acute hypoxemic respiratory failure (HCC) 05/23/2019   Blepharitis of left eye 03/24/2013   Hordeolum externum 03/22/2013   Morbid obesity (HCC) 02/01/2013   Allergic rhinitis 02/01/2013    Past Surgical History:  Procedure Laterality Date   ADENOIDECTOMY     TONSILLECTOMY         Family History  Problem Relation Age of Onset   Allergic rhinitis Brother    Asthma Brother     Social History   Tobacco Use   Smoking status: Never    Passive exposure: Yes   Smokeless tobacco: Never  Vaping Use   Vaping Use: Never used  Substance Use Topics   Alcohol use: Not Currently     Comment: rarely   Drug use: No    Home Medications Prior to Admission medications   Medication Sig Start Date End Date Taking? Authorizing Provider  acetaminophen (TYLENOL) 325 MG tablet Take 650 mg by mouth every 6 (six) hours as needed.    [provider]  olopatadine (PATANOL) 0.1 % ophthalmic solution Place 1 drop into the right eye 2 (two) times daily. 07/15/20   Bing Neighbors, FNP  tobramycin (TOBREX) 0.3 % ophthalmic solution Place 1 drop into the right eye every 4 (four) hours. 06/15/20   Moshe Cipro, NP    Allergies    Other and Pistachio nut (diagnostic)  Review of Systems   Review of Systems  Constitutional:  Negative for fever.  Respiratory:  Positive for cough, chest tightness and shortness of breath.   Cardiovascular:  Negative for chest pain and leg swelling.  Gastrointestinal:  Negative for abdominal pain.  All other systems reviewed and are negative.  Physical Exam Updated Vital Signs BP 109/62   Pulse (!) 101   Temp 98.1 F (36.7 C) (Oral)   Resp 20   Ht 1.905 m (6\' 3" )   Wt (!) 213 kg   SpO2 97%   BMI 58.69 kg/m   Physical Exam Vitals and nursing note reviewed.  Constitutional:  Appearance: He is well-developed.     Comments: Morbidly obese, nontoxic  HENT:     Head: Normocephalic and atraumatic.  Eyes:     Pupils: Pupils are equal, round, and reactive to light.  Cardiovascular:     Rate and Rhythm: Regular rhythm. Tachycardia present.     Heart sounds: Normal heart sounds. No murmur heard. Pulmonary:     Effort: Pulmonary effort is normal. No respiratory distress.     Breath sounds: Wheezing present.  Abdominal:     General: Bowel sounds are normal.     Palpations: Abdomen is soft.     Tenderness: There is no abdominal tenderness. There is no rebound.  Musculoskeletal:     Cervical back: Neck supple.     Right lower leg: No tenderness.     Left lower leg: No tenderness.  Lymphadenopathy:     Cervical: No  cervical adenopathy.  Skin:    General: Skin is warm and dry.  Neurological:     Mental Status: He is alert and oriented to person, place, and time.  Psychiatric:        Mood and Affect: Mood is anxious.    ED Results / Procedures / Treatments   Labs (all labs ordered are listed, but only abnormal results are displayed) Labs Reviewed  D-DIMER, QUANTITATIVE  TROPONIN I (HIGH SENSITIVITY)  TROPONIN I (HIGH SENSITIVITY)    EKG None  Radiology DG Chest 2 View  Result Date: 11/04/2020 CLINICAL DATA:  Chest pain EXAM: CHEST - 2 VIEW COMPARISON:  10/29/2020 FINDINGS: The lungs are symmetrically well expanded. Mild interstitial thickening is noted within the a lung bases bilaterally, likely chronic in nature. No confluent pulmonary infiltrate. No pneumothorax or pleural effusion. Cardiac size within normal limits. Pulmonary vascularity is normal. IMPRESSION: No active cardiopulmonary disease. Electronically Signed   By: Helyn Numbers MD   On: 11/04/2020 04:37    Procedures Procedures   Medications Ordered in ED Medications  ipratropium-albuterol (DUONEB) 0.5-2.5 (3) MG/3ML nebulizer solution 3 mL (3 mLs Nebulization Given 11/04/20 0205)    ED Course  I have reviewed the triage vital signs and the nursing notes.  Pertinent labs & imaging results that were available during my care of the patient were reviewed by me and considered in my medical decision making (see chart for details).  Clinical Course as of 11/04/20 0650  Sun Nov 04, 2020  0509 Patient reports significant improvement after DuoNeb. [CH]    Clinical Course User Index [CH] Itzabella Sorrels, Mayer Masker, MD   MDM Rules/Calculators/A&P                          Patient presents with dizziness and chest pressure.  He is overall nontoxic.  Morbidly obese.  Afebrile.  No respiratory distress.  He is wheezing on exam.  History of asthma and sarcoid.  He is on chronic prednisone.  He is otherwise young and with the exception of his  obesity low risk for ACS.  EKG without acute ischemic or arrhythmic changes.  Troponin x2 negative.  Doubt ACS.  D-dimer was sent to her stratify for PE and this is negative as well.  Chest x-ray without pneumothorax or pneumonia.  Patient was given a DuoNeb with improvement of his symptoms.  He has not had any other infectious symptoms and COVID testing is pending from urgent care visit 2 days ago.  After history, exam, and medical workup I feel the patient has been appropriately  medically screened and is safe for discharge home. Pertinent diagnoses were discussed with the patient. Patient was given return precautions.  Final Clinical Impression(s) / ED Diagnoses Final diagnoses:  Moderate persistent asthma with exacerbation  Atypical chest pain    Rx / DC Orders ED Discharge Orders     None        Darriel Sinquefield, Mayer Masker, MD 11/04/20 [phone]

## 2020-11-04 NOTE — ED Notes (Signed)
Pt says he feels a lot better after completing breathing tx

## 2020-11-04 NOTE — Progress Notes (Signed)
Called by RN to give nebulizer treatment, patient has not had a negative COVID test and has results pending from urgent care visit. Made her aware that it is policy that we have a negative COVID test before giving nebulizer.

## 2020-11-04 NOTE — ED Triage Notes (Signed)
Pt seen earlier and returns with c/o worsening chest pain and dizziness since discharge.

## 2020-11-07 ENCOUNTER — Emergency Department (HOSPITAL_COMMUNITY)
Admission: EM | Admit: 2020-11-07 | Discharge: 2020-11-07 | Disposition: A | Discharge status: Home / Self Care | Payer: Self-pay | Attending: Emergency Medicine | Admitting: Emergency Medicine

## 2020-11-07 ENCOUNTER — Other Ambulatory Visit: Payer: Self-pay

## 2020-11-07 ENCOUNTER — Emergency Department (HOSPITAL_COMMUNITY): Payer: Self-pay

## 2020-11-07 ENCOUNTER — Encounter (HOSPITAL_COMMUNITY): Payer: Self-pay

## 2020-11-07 ENCOUNTER — Emergency Department (HOSPITAL_BASED_OUTPATIENT_CLINIC_OR_DEPARTMENT_OTHER): Payer: Self-pay

## 2020-11-07 DIAGNOSIS — H44113 Panuveitis, bilateral: Secondary | ICD-10-CM | POA: Diagnosis present

## 2020-11-07 DIAGNOSIS — J45909 Unspecified asthma, uncomplicated: Secondary | ICD-10-CM | POA: Insufficient documentation

## 2020-11-07 DIAGNOSIS — R0602 Shortness of breath: Secondary | ICD-10-CM | POA: Insufficient documentation

## 2020-11-07 DIAGNOSIS — Z28310 Unvaccinated for covid-19: Secondary | ICD-10-CM | POA: Insufficient documentation

## 2020-11-07 DIAGNOSIS — R0609 Other forms of dyspnea: Secondary | ICD-10-CM

## 2020-11-07 DIAGNOSIS — H21543 Posterior synechiae (iris), bilateral: Secondary | ICD-10-CM | POA: Diagnosis present

## 2020-11-07 DIAGNOSIS — E876 Hypokalemia: Secondary | ICD-10-CM | POA: Insufficient documentation

## 2020-11-07 DIAGNOSIS — H01006 Unspecified blepharitis left eye, unspecified eyelid: Secondary | ICD-10-CM | POA: Diagnosis present

## 2020-11-07 DIAGNOSIS — Z6841 Body Mass Index (BMI) 40.0 and over, adult: Secondary | ICD-10-CM

## 2020-11-07 DIAGNOSIS — Z8616 Personal history of COVID-19: Secondary | ICD-10-CM | POA: Insufficient documentation

## 2020-11-07 DIAGNOSIS — R200 Anesthesia of skin: Secondary | ICD-10-CM | POA: Diagnosis present

## 2020-11-07 DIAGNOSIS — Z20822 Contact with and (suspected) exposure to covid-19: Secondary | ICD-10-CM | POA: Insufficient documentation

## 2020-11-07 DIAGNOSIS — R000 Tachycardia, unspecified: Secondary | ICD-10-CM | POA: Diagnosis present

## 2020-11-07 LAB — CBC WITH DIFFERENTIAL/PLATELET
Abs Immature Granulocytes: 0.16 10*3/uL — ABNORMAL HIGH (ref 0.00–0.07)
Basophils Absolute: 0.1 10*3/uL (ref 0.0–0.1)
Basophils Relative: 1 %
Eosinophils Absolute: 0.1 10*3/uL (ref 0.0–0.5)
Eosinophils Relative: 0 %
HCT: 51.9 % (ref 39.0–52.0)
Hemoglobin: 17.9 g/dL — ABNORMAL HIGH (ref 13.0–17.0)
Immature Granulocytes: 1 %
Lymphocytes Relative: 37 %
Lymphs Abs: 5.4 10*3/uL — ABNORMAL HIGH (ref 0.7–4.0)
MCH: 30.1 pg (ref 26.0–34.0)
MCHC: 34.5 g/dL (ref 30.0–36.0)
MCV: 87.2 fL (ref 80.0–100.0)
Monocytes Absolute: 1.1 10*3/uL — ABNORMAL HIGH (ref 0.1–1.0)
Monocytes Relative: 8 %
Neutro Abs: 7.7 10*3/uL (ref 1.7–7.7)
Neutrophils Relative %: 53 %
Platelets: 277 10*3/uL (ref 150–400)
RBC: 5.95 MIL/uL — ABNORMAL HIGH (ref 4.22–5.81)
RDW: 12.8 % (ref 11.5–15.5)
WBC: 14.5 10*3/uL — ABNORMAL HIGH (ref 4.0–10.5)
nRBC: 0 % (ref 0.0–0.2)

## 2020-11-07 LAB — BASIC METABOLIC PANEL
Anion gap: 15 (ref 5–15)
BUN: 20 mg/dL (ref 6–20)
CO2: 24 mmol/L (ref 22–32)
Calcium: 9.7 mg/dL (ref 8.9–10.3)
Chloride: 97 mmol/L — ABNORMAL LOW (ref 98–111)
Creatinine, Ser: 1.02 mg/dL (ref 0.61–1.24)
GFR, Estimated: >60 mL/min (ref >60)
Glucose, Bld: 104 mg/dL — ABNORMAL HIGH (ref 70–99)
Potassium: 3.1 mmol/L — ABNORMAL LOW (ref 3.5–5.1)
Sodium: 136 mmol/L (ref 135–145)

## 2020-11-07 LAB — BLOOD GAS, VENOUS
Acid-Base Excess: 2.5 mmol/L — ABNORMAL HIGH (ref 0.0–2.0)
Bicarbonate: 26.1 mmol/L (ref 20.0–28.0)
FIO2: 21
O2 Saturation: 74.8 %
Patient temperature: 36.6
pCO2, Ven: 38.1 mmHg — ABNORMAL LOW (ref 44.0–60.0)
pH, Ven: 7.451 — ABNORMAL HIGH (ref 7.250–7.430)
pO2, Ven: 40.2 mmHg (ref 32.0–45.0)

## 2020-11-07 LAB — RESP PANEL BY RT-PCR (FLU A&B, COVID) ARPGX2
Influenza A by PCR: NEGATIVE
Influenza B by PCR: NEGATIVE
SARS Coronavirus 2 by RT PCR: NEGATIVE

## 2020-11-07 LAB — ECHOCARDIOGRAM COMPLETE
Area-P 1/2: 5.16 cm2
Height: 75 in
S' Lateral: 3 cm
Weight: 7513.28 oz

## 2020-11-07 LAB — TROPONIN I (HIGH SENSITIVITY): Troponin I (High Sensitivity): 4 ng/L (ref <18)

## 2020-11-07 MED ORDER — ALBUTEROL (5 MG/ML) CONTINUOUS INHALATION SOLN
10.0000 mg/h | INHALATION_SOLUTION | Freq: Once | RESPIRATORY_TRACT | Status: AC
  Administered 2020-11-07: 10 mg/h via RESPIRATORY_TRACT
  Filled 2020-11-07: qty 20

## 2020-11-07 MED ORDER — VERAPAMIL HCL 120 MG PO TABS
120.0000 mg | ORAL_TABLET | Freq: Once | ORAL | Status: AC
  Administered 2020-11-07: 120 mg via ORAL
  Filled 2020-11-07: qty 1

## 2020-11-07 MED ORDER — IPRATROPIUM-ALBUTEROL 0.5-2.5 (3) MG/3ML IN SOLN: 3.0000 mL | Freq: Once | RESPIRATORY_TRACT | Status: DC

## 2020-11-07 MED ORDER — MAGNESIUM SULFATE 2 GM/50ML IV SOLN
2.0000 g | Freq: Once | INTRAVENOUS | Status: AC
  Administered 2020-11-07: 2 g via INTRAVENOUS
  Filled 2020-11-07: qty 50

## 2020-11-07 MED ORDER — PERFLUTREN LIPID MICROSPHERE
1.0000 mL | INTRAVENOUS | Status: DC | PRN
  Administered 2020-11-07: 3 mL via INTRAVENOUS
  Filled 2020-11-07: qty 10

## 2020-11-07 MED ORDER — POTASSIUM CHLORIDE ER 10 MEQ PO TBCR: 10.0000 meq | EXTENDED_RELEASE_TABLET | Freq: Every day | ORAL | 0 refills | Status: DC

## 2020-11-07 MED ORDER — VERAPAMIL HCL 120 MG PO TABS: 120.0000 mg | ORAL_TABLET | Freq: Three times a day (TID) | ORAL | 3 refills | Status: DC

## 2020-11-07 MED ORDER — POTASSIUM CHLORIDE ER 20 MEQ PO TBCR: 20.0000 meq | EXTENDED_RELEASE_TABLET | Freq: Every day | ORAL | 0 refills | Status: DC

## 2020-11-07 MED ORDER — VERAPAMIL HCL 80 MG PO TABS
80.0000 mg | ORAL_TABLET | Freq: Once | ORAL | Status: AC
  Administered 2020-11-07: 80 mg via ORAL
  Filled 2020-11-07: qty 1

## 2020-11-07 MED ORDER — IOHEXOL 350 MG/ML SOLN
100.0000 mL | Freq: Once | INTRAVENOUS | Status: AC | PRN
  Administered 2020-11-07: 100 mL via INTRAVENOUS

## 2020-11-07 MED ORDER — POTASSIUM CHLORIDE CRYS ER 20 MEQ PO TBCR
40.0000 meq | EXTENDED_RELEASE_TABLET | Freq: Once | ORAL | Status: AC
  Administered 2020-11-07: 40 meq via ORAL
  Filled 2020-11-07: qty 2

## 2020-11-07 MED ORDER — POTASSIUM CHLORIDE CRYS ER 20 MEQ PO TBCR
40.0000 meq | EXTENDED_RELEASE_TABLET | Freq: Once | ORAL | Status: AC
  Administered 2020-11-07: 40 meq via ORAL
  Filled 2020-11-07: qty 4

## 2020-11-07 MED ORDER — LORAZEPAM 2 MG/ML IJ SOLN: 1.0000 mg | Freq: Once | INTRAMUSCULAR | Status: DC | PRN

## 2020-11-07 NOTE — TOC Progression Note (Signed)
Transition of Care [facility]) - Progression Note    Patient Details  Name: Roy Rowe MRN: [id number] Date of Birth: [birth year]  Transition of Care [facility]) CM/SW Contact  Karn Cassis, [place] Phone Number: 11/07/2020, 2:38 PM  Clinical Narrative:   Per MD, pt needs PCP. LCSW discussed referral to [facility] as pt does not have insurance. Pt agreeable. Referral made to [facility].          Expected Discharge Plan and Services                                                 Social Determinants of Health (SDOH) Interventions    Readmission Risk Interventions No flowsheet data found.

## 2020-11-07 NOTE — Consult Note (Signed)
Patient Demographics:    Roy Rowe, is a 25 y.o. male  MRN: [id number]   DOB - [birth year]  Admit Date - 11/07/2020  Outpatient Primary MD for the patient is Patient, [name]   Assessment & Plan:    Active Problems:   Blepharitis of left eye   Panuveitis of both eyes   Morbid obesity with BMI of 50.0-59.9, adult (HCC)   Sinus tachycardia   Left arm numbness--No weakness   Posterior synechiae (iris), bilateral   1)Hypertension with Sinus Tachycardia--echo from 11/07/2020 reassuring with EF of 60 to 65% without regional wall motion abnormalities -Troponin WNL -EKG with sinus tachycardia no acute changes -We will avoid beta-blockers due to underlying asthma, treat empirically with verapamil as ordered -Heart rate improved in the ED after p.o. verapamil -Patient is to establish care with PCP on follow-up for ongoing management of hypertension and sinus tach -TSH from February 2022 was 3.5 -CTA chest without acute PE or other acute findings -Bronchodilators probably perpetuating tachycardia  2)Morbid Obesity/Hepatic Steatosis- -Low calorie diet, portion control and increase physical activity discussed with patient -Body mass index is 57.13 kg/m. -- Patient encouraged to establish care with PCP for ongoing management  3)Sarcoidosis and Uveitis--- currently on high-dose prednisone, patient is to Follow-up with ophthalmologist for dose adjustment of prednisone -Follow-up with rheumatology  4)Lt Forearm Numbness----denies weakness, no significant tingling, Tinel and Phalen signs are negative -No obvious deformity/abnormalities on exam -Good radial pulse -CT head without acute findings -Unable to get brain MRI as well as MRI of the C-spine as patient weight is >>> 450 pounds -Patient will need  outpatient MRI in [facility] for morbidly obese people  5)Leukocytosis--WBC 14.5 suspect steroid related, no evidence of acute infection otherwise  6) mild intermittent asthma--- patient is on high-dose prednisone due to sarcoidosis/uveitis--be judicious with bronchodilators due to propensity towards sinus tachycardia  7) social/ethics----plan of care discussed with patient and girlfriend at bedside, Child psychotherapist gave patient information on how to establish care with PCP in the area  Discharge Instructions:-  1)Please follow-up with your ophthalmologist for further adjustments of your steroid/prednisone dose 2)Outpatient follow-up with neurologist or rheumatologist advised due to sarcoidosis with left arm numbness Follow up with Rheumatologist--- Dr. Stefano Gaul Aryal----Address: [address] [place], [place], [place] [address], Phone: [phone] 3)Please establish care with a primary care physician to coordinate the overall healthcare 4)You may need MRI of the brain and MRA of the neck in [facility] as the MRI machine here at [facility] has a weight Limit 5) please take verapamil as prescribed for blood pressure and heart rate  Disposition/--Home   With History of - Reviewed by me  Past Medical History:  Diagnosis Date   Allergic rhinitis    Asthma    Obesity (BMI 30-39.9)    Sarcoidosis       Past Surgical History:  Procedure Laterality Date   ADENOIDECTOMY     TONSILLECTOMY  Chief Complaint  Patient presents with   Shortness of Breath   Numbness      HPI:    Roy Rowe  is a 25 y.o. 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 daily for the last 6 months presents to the ED with left arm numbness without any weakness, endorses chronic neck discomfort, no headaches, no new visual concerns, no chest pains,   -GirlFriend at bedside, apparently has had trouble sleeping lately due to palpitations and tachycardia -Multiple recent urgent care and ED visits with negative work-up -Left arm numbness started about 3 days ago, again denies left arm weakness, denies paresthesia -No fever  Or chills  No Nausea, Vomiting or Diarrhea -In ED COVID-negative this time around -Troponin not elevated -WBC 14.5 , but patient is chronically on steroids -Potassium is 3.1 CTA chest without acute PE or other acute cardiopulmonary findings patient does have hepatic steatosis -CT head without acute intracranial pathology -TSH in February 2022 was 3.5 -Patient was initially tachycardic up to the 130s in the ED with elevated blood pressure systolic in 150s, patient received verapamil in the ED, blood pressure down to the 130s systolic and heart rates down to just over 100 -EKG sinus tachycardia   Review of systems:    In addition to the HPI above,   A full Review of  Systems was done, all other systems reviewed are negative except as noted above in HPI , .    Social History:  Reviewed by me    Social History   Tobacco Use   Smoking status: Never    Passive exposure: Yes   Smokeless tobacco: Never  Substance Use Topics   Alcohol use: Not Currently    Comment: rarely       Family History :  Reviewed by me    Family History  Problem Relation Age of Onset   Allergic rhinitis Brother    Asthma Brother     Home Medications:   Prior to Admission medications   Medication Sig Start Date End Date Taking? Authorizing Provider  acetaminophen (TYLENOL) 325 MG tablet Take 650 mg by mouth every 6 (six) hours as needed.   Yes [provider]  albuterol (VENTOLIN HFA) 108 (90 Base) MCG/ACT inhaler Inhale 1-2 puffs into the lungs every 6 (six) hours as needed for wheezing or shortness of breath.   Yes [provider]  potassium chloride (KLOR-CON) 10 MEQ tablet Take 1 tablet (10 mEq total) by  mouth daily. 11/07/20 12/07/20 Yes Trifan, Kermit Balo, MD  predniSONE (DELTASONE) 10 MG tablet Take 40 mg by mouth daily. 10/27/20  Yes [provider]  azaTHIOprine (IMURAN) 50 MG tablet Take 50 mg by mouth daily. Patient not taking: No sig reported 09/06/20   [provider]  olopatadine (PATANOL) 0.1 % ophthalmic solution Place 1 drop into the right eye 2 (two) times daily. Patient not taking: No sig reported 07/15/20   Bing Neighbors, FNP  tobramycin (TOBREX) 0.3 % ophthalmic solution Place 1 drop into the right eye every 4 (four) hours. Patient not taking: No sig reported 06/15/20   Moshe Cipro, NP     Allergies:     Allergies  Allergen Reactions   Other Anaphylaxis   Pistachio Nut (Diagnostic) Swelling     Physical Exam:   Vitals  Blood pressure 131/78, pulse (!) 109, temperature 97.9 F (36.6 C), temperature  source Oral, resp. rate 18, height  (1.905 m), weight (!) 207.3 kg, SpO2 98 %.  Physical Examination: General appearance - alert, morbidly obese l appearing, and in no distress  Mental status - alert, oriented to person, place, and time,  Eyes - sclera anicteric Neck - supple, no JVD elevation , Chest - clear  to auscultation bilaterally, symmetrical air movement, (as per EDP patient was wheezy earlier when he got to the ED, he received bronchodilators apparently, at the time of my evaluation lungs are clear with good air movement) Heart - S1 and S2 normal, regular , tachy 106 Abdomen - soft, nontender, nondistended, increased truncal adiposity Neurological - screening mental status exam normal, neck supple without rigidity, cranial nerves II through XII intact, DTR's normal and symmetric, left arm numbness, negative Tinel and Phalen signs,, good radial pulse, overlying skin without erythema, swelling warmth or tenderness Extremities - no pedal edema noted, intact peripheral pulses  Skin - warm, dry     Data Review:    CBC Recent Labs   Lab 11/03/20 2339 11/07/20 0710  WBC  --  14.5*  HGB 17.3* 17.9*  HCT 51.0 51.9  PLT  --  277  MCV  --  87.2  MCH  --  30.1  MCHC  --  34.5  RDW  --  12.8  LYMPHSABS  --  5.4*  MONOABS  --  1.1*  EOSABS  --  0.1  BASOSABS  --  0.1   ------------------------------------------------------------------------------------------------------------------  Chemistries  Recent Labs  Lab 11/03/20 2339 11/07/20 0710  NA 137 136  K 3.6 3.1*  CL 100 97*  CO2  --  24  GLUCOSE 155* 104*  BUN 23* 20  CREATININE 0.70 1.02  CALCIUM  --  9.7   ------------------------------------------------------------------------------------------------------------------ estimated creatinine clearance is 214.7 mL/min (by C-G formula based on SCr of 1.02 mg/dL). ------------------------------------------------------------------------------------------------------------------ No results for input(s): TSH, T4TOTAL, T3FREE, THYROIDAB in the last 72 hours.  Invalid input(s): FREET3   Coagulation profile No results for input(s): INR, PROTIME in the last 168 hours. ------------------------------------------------------------------------------------------------------------------- No results for input(s): DDIMER in the last 72 hours. -------------------------------------------------------------------------------------------------------------------  Cardiac Enzymes No results for input(s): CKMB, TROPONINI, MYOGLOBIN in the last 168 hours.  Invalid input(s): CK ------------------------------------------------------------------------------------------------------------------ No results found for: BNP   ---------------------------------------------------------------------------------------------------------------  Urinalysis No results found for: COLORURINE, APPEARANCEUR, LABSPEC, PHURINE, GLUCOSEU, HGBUR, BILIRUBINUR, KETONESUR, PROTEINUR, UROBILINOGEN, NITRITE,  LEUKOCYTESUR  ----------------------------------------------------------------------------------------------------------------   Imaging Results:    CT Head Wo Contrast  Result Date: 11/07/2020 CLINICAL DATA:  Headaches, no known injury, initial encounter EXAM: CT HEAD WITHOUT CONTRAST TECHNIQUE: Contiguous axial images were obtained from the base of the skull through the vertex without intravenous contrast. COMPARISON:  None. FINDINGS: Brain: No evidence of acute infarction, hemorrhage, hydrocephalus, extra-axial collection or mass lesion/mass effect. Vascular: No hyperdense vessel or unexpected calcification. Skull: Normal. Negative for fracture or focal lesion. Sinuses/Orbits: Mild mucosal thickening is noted in the right maxillary antrum. Other: None IMPRESSION: Mild mucosal thickening in the right maxillary antrum. No acute intracranial abnormality noted. Electronically Signed   By: Alcide Clever M.D.   On: 11/07/2020 09:49   CT Angio Chest PE W and/or Wo Contrast  Result Date: 11/07/2020 CLINICAL DATA:  Chest pressure, shortness of breath and left arm numbness. EXAM: CT ANGIOGRAPHY CHEST WITH CONTRAST TECHNIQUE: Multidetector CT imaging of the chest was performed using the standard protocol during bolus administration of intravenous contrast. Multiplanar CT image reconstructions and MIPs were obtained to evaluate the vascular  anatomy. CONTRAST:  100mL OMNIPAQUE IOHEXOL 350 MG/ML SOLN COMPARISON:  Prior CTA of the chest on 05/19/2019 FINDINGS: Cardiovascular: Pulmonary arterial opacification is limited beyond the segmental level. No evidence of pulmonary embolism. Central pulmonary arteries are normal in caliber. The heart size is normal. No pericardial fluid identified. The thoracic aorta is of normal caliber and demonstrates no evidence of atherosclerosis or dissection. Mediastinum/Nodes: No enlarged mediastinal, hilar, or axillary lymph nodes. Thyroid gland, trachea, and esophagus demonstrate no  significant findings. Lungs/Pleura: There is no evidence of pulmonary edema, consolidation, pneumothorax, nodule or pleural fluid. Upper Abdomen: Diffuse hepatic steatosis. Musculoskeletal: No chest wall abnormality. No acute or significant osseous findings. Mild thoracolumbar scoliosis. Review of the MIP images confirms the above findings. IMPRESSION: No evidence of pulmonary embolism. Limited opacification of pulmonary arteries at the subsegmental level. Diffuse hepatic steatosis. Electronically Signed   By: Irish LackGlenn  Yamagata M.D.   On: 11/07/2020 09:43   ECHOCARDIOGRAM COMPLETE  Result Date: 11/07/2020    ECHOCARDIOGRAM REPORT   Patient Name:   Roy Rowe Date of Exam: 11/07/2020 Medical Rec #:  [id number]    Height:       75.0 in Accession #:    [id number]   Weight:       469.6 lb Date of Birth:  Oct 15, 1995   BSA:          3.154 m Patient Age:    24 years     BP:           142/70 mmHg Patient Gender: M            HR:           120 bpm. [facility] Procedure: 2D Echo, Cardiac Doppler and Color Doppler Indications:    Dyspnea R06.00  History:        Patient has no prior history of Echocardiogram examinations.                 Morbid obesity, Sarcoidosis (From Hx).  Sonographer:    Celesta GentileBernard White RCS Referring Phys: [id number] [facility]  Sonographer Comments: Image acquisition challenging due to patient body habitus. IMPRESSIONS  1. Left ventricular ejection fraction, by estimation, is 60 to 65%. The left ventricle has normal function. The left ventricle has no regional wall motion abnormalities. There is mild left ventricular hypertrophy. Left ventricular diastolic parameters are indeterminate.  2. Right ventricular systolic function is normal. The right ventricular size is normal. Tricuspid regurgitation signal is inadequate for assessing PA pressure.  3. The mitral valve is grossly normal. Trivial mitral valve regurgitation.  4. The aortic valve is tricuspid. Aortic valve regurgitation is not  visualized.  5. Unable to estimate CVP. FINDINGS  Left Ventricle: Left ventricular ejection fraction, by estimation, is 60 to 65%. The left ventricle has normal function. The left ventricle has no regional wall motion abnormalities. Definity contrast agent was given IV to delineate the left ventricular  endocardial borders. The left ventricular internal cavity size was normal in size. There is mild left ventricular hypertrophy. Left ventricular diastolic parameters are indeterminate. Right Ventricle: The right ventricular size is normal. No increase in right ventricular wall thickness. Right ventricular systolic function is normal. Tricuspid regurgitation signal is inadequate for assessing PA pressure. Left Atrium: Left atrial size was normal in size. Right Atrium: Right atrial size was normal in size. Pericardium: There is no evidence of pericardial effusion. Mitral Valve: The mitral valve is grossly normal. Trivial mitral valve regurgitation. Tricuspid Valve: The  tricuspid valve is grossly normal. Tricuspid valve regurgitation is trivial. Aortic Valve: The aortic valve is tricuspid. Aortic valve regurgitation is not visualized. Pulmonic Valve: The pulmonic valve was grossly normal. Pulmonic valve regurgitation is not visualized. Aorta: The aortic root is normal in size and structure. Venous: Unable to estimate CVP. The inferior vena cava was not well visualized. IAS/Shunts: The interatrial septum was not well visualized.  LEFT VENTRICLE PLAX 2D LVIDd:         4.20 cm LVIDs:         3.00 cm LV PW:         1.10 cm LV IVS:        1.10 cm LVOT diam:     2.00 cm LV SV:         51 LV SV Index:   16 LVOT Area:     3.14 cm  RIGHT VENTRICLE RV S prime:     16.80 cm/s TAPSE (M-mode): 2.1 cm LEFT ATRIUM         Index LA diam:    3.20 cm 1.01 cm/m  AORTIC VALVE LVOT Vmax:   117.00 cm/s LVOT Vmean:  74.800 cm/s LVOT VTI:    0.162 m  AORTA Ao Root diam: 3.20 cm MITRAL VALVE MV Area (PHT): 5.16 cm     SHUNTS MV Decel Time:  147 msec     Systemic VTI:  0.16 m MV E velocity: 104.00 cm/s  Systemic Diam: 2.00 cm Roy Dell MD Electronically signed by Roy Dell MD Signature Date/Time: 11/07/2020/2:34:42 PM    Final     Radiological Exams on Admission: CT Head Wo Contrast  Result Date: 11/07/2020 CLINICAL DATA:  Headaches, no known injury, initial encounter EXAM: CT HEAD WITHOUT CONTRAST TECHNIQUE: Contiguous axial images were obtained from the base of the skull through the vertex without intravenous contrast. COMPARISON:  None. FINDINGS: Brain: No evidence of acute infarction, hemorrhage, hydrocephalus, extra-axial collection or mass lesion/mass effect. Vascular: No hyperdense vessel or unexpected calcification. Skull: Normal. Negative for fracture or focal lesion. Sinuses/Orbits: Mild mucosal thickening is noted in the right maxillary antrum. Other: None IMPRESSION: Mild mucosal thickening in the right maxillary antrum. No acute intracranial abnormality noted. Electronically Signed   By: Alcide Clever M.D.   On: 11/07/2020 09:49   CT Angio Chest PE W and/or Wo Contrast  Result Date: 11/07/2020 CLINICAL DATA:  Chest pressure, shortness of breath and left arm numbness. EXAM: CT ANGIOGRAPHY CHEST WITH CONTRAST TECHNIQUE: Multidetector CT imaging of the chest was performed using the standard protocol during bolus administration of intravenous contrast. Multiplanar CT image reconstructions and MIPs were obtained to evaluate the vascular anatomy. CONTRAST:  OMNIPAQUE IOHEXOL 350 MG/ML SOLN COMPARISON:  Prior CTA of the chest on 05/19/2019 FINDINGS: Cardiovascular: Pulmonary arterial opacification is limited beyond the segmental level. No evidence of pulmonary embolism. Central pulmonary arteries are normal in caliber. The heart size is normal. No pericardial fluid identified. The thoracic aorta is of normal caliber and demonstrates no evidence of atherosclerosis or dissection. Mediastinum/Nodes: No enlarged mediastinal,  hilar, or axillary lymph nodes. Thyroid gland, trachea, and esophagus demonstrate no significant findings. Lungs/Pleura: There is no evidence of pulmonary edema, consolidation, pneumothorax, nodule or pleural fluid. Upper Abdomen: Diffuse hepatic steatosis. Musculoskeletal: No chest wall abnormality. No acute or significant osseous findings. Mild thoracolumbar scoliosis. Review of the MIP images confirms the above findings. IMPRESSION: No evidence of pulmonary embolism. Limited opacification of pulmonary arteries at the subsegmental level. Diffuse hepatic steatosis. Electronically Signed  By: Irish Lack M.D.   On: 11/07/2020 09:43   ECHOCARDIOGRAM COMPLETE  Result Date: 11/07/2020    ECHOCARDIOGRAM REPORT   Patient Name:   AVYON HERENDEEN Date of Exam: 11/07/2020 Medical Rec #:  [id number]    Height:       75.0 in Accession #:    [id number]   Weight:       469.6 lb Date of Birth:  1995-05-26   BSA:          3.154 m Patient Age:    24 years     BP:           142/70 mmHg Patient Gender: M            HR:           120 bpm. [facility] Procedure: 2D Echo, Cardiac Doppler and Color Doppler Indications:    Dyspnea R06.00  History:        Patient has no prior history of Echocardiogram examinations.                 Morbid obesity, Sarcoidosis (From Hx).  Sonographer:    Celesta Gentile RCS Referring Phys: [id number] [facility]  Sonographer Comments: Image acquisition challenging due to patient body habitus. IMPRESSIONS  1. Left ventricular ejection fraction, by estimation, is 60 to 65%. The left ventricle has normal function. The left ventricle has no regional wall motion abnormalities. There is mild left ventricular hypertrophy. Left ventricular diastolic parameters are indeterminate.  2. Right ventricular systolic function is normal. The right ventricular size is normal. Tricuspid regurgitation signal is inadequate for assessing PA pressure.  3. The mitral valve is grossly normal. Trivial mitral valve  regurgitation.  4. The aortic valve is tricuspid. Aortic valve regurgitation is not visualized.  5. Unable to estimate CVP. FINDINGS  Left Ventricle: Left ventricular ejection fraction, by estimation, is 60 to 65%. The left ventricle has normal function. The left ventricle has no regional wall motion abnormalities. Definity contrast agent was given IV to delineate the left ventricular  endocardial borders. The left ventricular internal cavity size was normal in size. There is mild left ventricular hypertrophy. Left ventricular diastolic parameters are indeterminate. Right Ventricle: The right ventricular size is normal. No increase in right ventricular wall thickness. Right ventricular systolic function is normal. Tricuspid regurgitation signal is inadequate for assessing PA pressure. Left Atrium: Left atrial size was normal in size. Right Atrium: Right atrial size was normal in size. Pericardium: There is no evidence of pericardial effusion. Mitral Valve: The mitral valve is grossly normal. Trivial mitral valve regurgitation. Tricuspid Valve: The tricuspid valve is grossly normal. Tricuspid valve regurgitation is trivial. Aortic Valve: The aortic valve is tricuspid. Aortic valve regurgitation is not visualized. Pulmonic Valve: The pulmonic valve was grossly normal. Pulmonic valve regurgitation is not visualized. Aorta: The aortic root is normal in size and structure. Venous: Unable to estimate CVP. The inferior vena cava was not well visualized. IAS/Shunts: The interatrial septum was not well visualized.  LEFT VENTRICLE PLAX 2D LVIDd:         4.20 cm LVIDs:         3.00 cm LV PW:         1.10 cm LV IVS:        1.10 cm LVOT diam:     2.00 cm LV SV:         51 LV SV Index:   16 LVOT Area:  3.14 cm  RIGHT VENTRICLE RV S prime:     16.80 cm/s TAPSE (M-mode): 2.1 cm LEFT ATRIUM         Index LA diam:    3.20 cm 1.01 cm/m  AORTIC VALVE LVOT Vmax:   117.00 cm/s LVOT Vmean:  74.800 cm/s LVOT VTI:    0.162 m  AORTA Ao  Root diam: 3.20 cm MITRAL VALVE MV Area (PHT): 5.16 cm     SHUNTS MV Decel Time: 147 msec     Systemic VTI:  0.16 m MV E velocity: 104.00 cm/s  Systemic Diam: 2.00 cm Roy Dell MD Electronically signed by Roy Dell MD Signature Date/Time: 11/07/2020/2:34:42 PM    Final     AM Labs Ordered, also please review Full Orders  Family Communication: Admission, patients condition and plan of care including tests being ordered have been discussed with the patient and girlfriend who indicate understanding and agree with the plan   Code Status - Full Code  Likely DC to  home  Condition   stable Shon Hale M.D on 11/07/2020 at 4:38 PM Go to [email or web address] -  for contact info  [facility] - Office  [phone]

## 2020-11-07 NOTE — ED Provider Notes (Signed)
[facility] EMERGENCY DEPARTMENT Provider Note   CSN: [id number] Arrival date & time: 11/07/20  4098     History Chief Complaint  Patient presents with   Shortness of Breath   Numbness    Roy Rowe is a 25 y.o. male w/ hx of sarcoidosis, uveitis, on daily prednisone, obesity, asthma, presenting to ED with shortness of breath and tachycardia.  He has been seen 3-4 times in the ED and urgent care this week for persistent tachycardia, shortness of breath and left arm numbness.  He reports the numbness began about 3 days ago, seen on 11/04/20 in ED for this, had negative troponins and a normal xray.   He has never had these symptoms before.  He reports chronic neck pain issues.  He received a dexamethasone injection at [facility] 1 week ago and feels this triggered his tachycardia  He reports he had covid 1 year ago, has not had the vaccines. He does not smoke.  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   Retinal edema 08/24/2020   Pneumonia due to COVID-19 virus 05/23/2019   Acute hypoxemic respiratory failure (HCC) 05/23/2019   Blepharitis of left eye 03/24/2013   Hordeolum externum 03/22/2013   Morbid obesity (HCC) 02/01/2013   Allergic rhinitis 02/01/2013    Past Surgical History:  Procedure Laterality Date   ADENOIDECTOMY     TONSILLECTOMY         Family History  Problem Relation Age of Onset   Allergic rhinitis Brother    Asthma Brother     Social History   Tobacco Use   Smoking status: Never    Passive exposure: Yes   Smokeless tobacco: Never   Vaping Use   Vaping Use: Never used  Substance Use Topics   Alcohol use: Not Currently    Comment: rarely   Drug use: No    Home Medications Prior to Admission medications   Medication Sig Start Date End Date Taking? Authorizing Provider  acetaminophen (TYLENOL) 325 MG tablet Take 650 mg by mouth every 6 (six) hours as needed.   Yes [provider]  albuterol (VENTOLIN HFA) 108 (90 Base) MCG/ACT inhaler Inhale 1-2 puffs into the lungs every 6 (six) hours as needed for wheezing or shortness of breath.   Yes [provider]  Potassium Chloride ER 20 MEQ TBCR Take 20 mEq by mouth daily. 1 tab daily by mouth 11/07/20  Yes Emokpae, Courage, MD  predniSONE (DELTASONE) 10 MG tablet Take 40 mg by mouth daily. 10/27/20  Yes [provider]  verapamil (CALAN) 120 MG tablet Take 1 tablet (120 mg total) by mouth 3 (three) times daily. 11/07/20  Yes Emokpae, Courage, MD  azaTHIOprine (IMURAN) 50 MG tablet Take 50 mg by mouth daily. Patient not taking: No sig reported 09/06/20   [provider]  olopatadine (PATANOL) 0.1 % ophthalmic solution Place 1 drop into the right eye 2 (two) times daily. Patient not taking: No sig reported 07/15/20   Bing Neighbors, FNP  tobramycin (TOBREX) 0.3 %  ophthalmic solution Place 1 drop into the right eye every 4 (four) hours. Patient not taking: No sig reported 06/15/20   Moshe Cipro, NP    Allergies    Other and Pistachio nut (diagnostic)  Review of Systems   Review of Systems  Constitutional:  Negative for chills and fever.  HENT:  Negative for ear pain and sore throat.   Eyes:  Negative for pain and visual disturbance.  Respiratory:  Positive for shortness of breath. Negative for cough.   Cardiovascular:  Positive for palpitations. Negative for chest pain.  Gastrointestinal:  Negative for abdominal pain and vomiting.  Genitourinary:  Negative for dysuria and hematuria.  Musculoskeletal:  Positive for arthralgias and  neck pain.  Skin:  Negative for color change and rash.  Neurological:  Positive for numbness. Negative for seizures and syncope.  All other systems reviewed and are negative.  Physical Exam Updated Vital Signs BP 131/78   Pulse (!) 109   Temp 97.9 F (36.6 C) (Oral)   Resp 18   Ht  (1.905 m)   Wt (!) 207.3 kg   SpO2 98%   BMI 57.13 kg/m   Physical Exam Constitutional:      General: He is not in acute distress. HENT:     Head: Normocephalic and atraumatic.  Eyes:     Conjunctiva/sclera: Conjunctivae normal.     Pupils: Pupils are equal, round, and reactive to light.  Cardiovascular:     Rate and Rhythm: Regular rhythm. Tachycardia present.     Comments: HR 120 bpm and regular Pulmonary:     Effort: Pulmonary effort is normal. No respiratory distress.     Breath sounds: Wheezing present.  Abdominal:     General: There is no distension.     Tenderness: There is no abdominal tenderness.  Skin:    General: Skin is warm and dry.  Neurological:     General: No focal deficit present.     Mental Status: He is alert and oriented to person, place, and time. Mental status is at baseline.     GCS: GCS eye subscore is 4. GCS verbal subscore is 5. GCS motor subscore is 6.     Cranial Nerves: Cranial nerves are intact.     Motor: Motor function is intact. No weakness.     Coordination: Coordination is intact.     Comments: Reports paresthesias in left shoulder and arm  Psychiatric:        Mood and Affect: Mood normal.        Behavior: Behavior normal.    ED Results / Procedures / Treatments   Labs (all labs ordered are listed, but only abnormal results are displayed) Labs Reviewed  BASIC METABOLIC PANEL - Abnormal; Notable for the following components:      Result Value   Potassium 3.1 (*)    Chloride 97 (*)    Glucose, Bld 104 (*)    All other components within normal limits  CBC WITH DIFFERENTIAL/PLATELET - Abnormal; Notable for the following components:   WBC 14.5  (*)    RBC 5.95 (*)    Hemoglobin 17.9 (*)    Lymphs Abs 5.4 (*)    Monocytes Absolute 1.1 (*)    Abs Immature Granulocytes 0.16 (*)    All other components within normal limits  BLOOD GAS, VENOUS - Abnormal; Notable for the following components:   pH, Ven 7.451 (*)    pCO2, Ven 38.1 (*)    Acid-Base Excess 2.5 (*)  All other components within normal limits  RESP PANEL BY RT-PCR (FLU A&B, COVID) [id number]  TROPONIN I (HIGH SENSITIVITY)    EKG None  Radiology CT Head Wo Contrast  Result Date: 11/07/2020 CLINICAL DATA:  Headaches, no known injury, initial encounter EXAM: CT HEAD WITHOUT CONTRAST TECHNIQUE: Contiguous axial images were obtained from the base of the skull through the vertex without intravenous contrast. COMPARISON:  None. FINDINGS: Brain: No evidence of acute infarction, hemorrhage, hydrocephalus, extra-axial collection or mass lesion/mass effect. Vascular: No hyperdense vessel or unexpected calcification. Skull: Normal. Negative for fracture or focal lesion. Sinuses/Orbits: Mild mucosal thickening is noted in the right maxillary antrum. Other: None IMPRESSION: Mild mucosal thickening in the right maxillary antrum. No acute intracranial abnormality noted. Electronically Signed   By: Alcide Clever M.D.   On: 11/07/2020 09:49   CT Angio Chest PE W and/or Wo Contrast  Result Date: 11/07/2020 CLINICAL DATA:  Chest pressure, shortness of breath and left arm numbness. EXAM: CT ANGIOGRAPHY CHEST WITH CONTRAST TECHNIQUE: Multidetector CT imaging of the chest was performed using the standard protocol during bolus administration of intravenous contrast. Multiplanar CT image reconstructions and MIPs were obtained to evaluate the vascular anatomy. CONTRAST:  OMNIPAQUE IOHEXOL 350 MG/ML SOLN COMPARISON:  Prior CTA of the chest on 05/19/2019 FINDINGS: Cardiovascular: Pulmonary arterial opacification is limited beyond the segmental level. No evidence of pulmonary embolism. Central  pulmonary arteries are normal in caliber. The heart size is normal. No pericardial fluid identified. The thoracic aorta is of normal caliber and demonstrates no evidence of atherosclerosis or dissection. Mediastinum/Nodes: No enlarged mediastinal, hilar, or axillary lymph nodes. Thyroid gland, trachea, and esophagus demonstrate no significant findings. Lungs/Pleura: There is no evidence of pulmonary edema, consolidation, pneumothorax, nodule or pleural fluid. Upper Abdomen: Diffuse hepatic steatosis. Musculoskeletal: No chest wall abnormality. No acute or significant osseous findings. Mild thoracolumbar scoliosis. Review of the MIP images confirms the above findings. IMPRESSION: No evidence of pulmonary embolism. Limited opacification of pulmonary arteries at the subsegmental level. Diffuse hepatic steatosis. Electronically Signed   By: Irish Lack M.D.   On: 11/07/2020 09:43   ECHOCARDIOGRAM COMPLETE  Result Date: 11/07/2020    ECHOCARDIOGRAM REPORT   Patient Name:   Roy Rowe Date of Exam: 11/07/2020 Medical Rec #:  [id number]    Height:       75.0 in Accession #:    [id number]   Weight:       469.6 lb Date of Birth:  1995/06/01   BSA:          3.154 m Patient Age:    24 years     BP:           142/70 mmHg Patient Gender: M            HR:           120 bpm. [facility] Procedure: 2D Echo, Cardiac Doppler and Color Doppler Indications:    Dyspnea R06.00  History:        Patient has no prior history of Echocardiogram examinations.                 Morbid obesity, Sarcoidosis (From Hx).  Sonographer:    Celesta Gentile RCS Referring Phys: [id number] [employer] [facility]  Sonographer Comments: Image acquisition challenging due to patient body habitus. IMPRESSIONS  1. Left ventricular ejection fraction, by estimation, is 60 to 65%. The left ventricle has normal function. The left ventricle has no regional wall motion  abnormalities. There is mild left ventricular hypertrophy. Left ventricular diastolic  parameters are indeterminate.  2. Right ventricular systolic function is normal. The right ventricular size is normal. Tricuspid regurgitation signal is inadequate for assessing PA pressure.  3. The mitral valve is grossly normal. Trivial mitral valve regurgitation.  4. The aortic valve is tricuspid. Aortic valve regurgitation is not visualized.  5. Unable to estimate CVP. FINDINGS  Left Ventricle: Left ventricular ejection fraction, by estimation, is 60 to 65%. The left ventricle has normal function. The left ventricle has no regional wall motion abnormalities. Definity contrast agent was given IV to delineate the left ventricular  endocardial borders. The left ventricular internal cavity size was normal in size. There is mild left ventricular hypertrophy. Left ventricular diastolic parameters are indeterminate. Right Ventricle: The right ventricular size is normal. No increase in right ventricular wall thickness. Right ventricular systolic function is normal. Tricuspid regurgitation signal is inadequate for assessing PA pressure. Left Atrium: Left atrial size was normal in size. Right Atrium: Right atrial size was normal in size. Pericardium: There is no evidence of pericardial effusion. Mitral Valve: The mitral valve is grossly normal. Trivial mitral valve regurgitation. Tricuspid Valve: The tricuspid valve is grossly normal. Tricuspid valve regurgitation is trivial. Aortic Valve: The aortic valve is tricuspid. Aortic valve regurgitation is not visualized. Pulmonic Valve: The pulmonic valve was grossly normal. Pulmonic valve regurgitation is not visualized. Aorta: The aortic root is normal in size and structure. Venous: Unable to estimate CVP. The inferior vena cava was not well visualized. IAS/Shunts: The interatrial septum was not well visualized.  LEFT VENTRICLE PLAX 2D LVIDd:         4.20 cm LVIDs:         3.00 cm LV PW:         1.10 cm LV IVS:        1.10 cm LVOT diam:     2.00 cm LV SV:         51 LV SV  Index:   16 LVOT Area:     3.14 cm  RIGHT VENTRICLE RV S prime:     16.80 cm/s TAPSE (M-mode): 2.1 cm LEFT ATRIUM         Index LA diam:    3.20 cm 1.01 cm/m  AORTIC VALVE LVOT Vmax:   117.00 cm/s LVOT Vmean:  74.800 cm/s LVOT VTI:    0.162 m  AORTA Ao Root diam: 3.20 cm MITRAL VALVE MV Area (PHT): 5.16 cm     SHUNTS MV Decel Time: 147 msec     Systemic VTI:  0.16 m MV E velocity: 104.00 cm/s  Systemic Diam: 2.00 cm Nona Dell MD Electronically signed by Nona Dell MD Signature Date/Time: 11/07/2020/2:34:42 PM    Final     Procedures .Critical Care  Date/Time: 11/07/2020 4:54 PM Performed by: Terald Sleeper, MD Authorized by: Terald Sleeper, MD   Critical care provider statement:    Critical care time (minutes):  35   Critical care was necessary to treat or prevent imminent or life-threatening deterioration of the following conditions:  Respiratory failure   Critical care was time spent personally by me on the following activities:  Discussions with consultants, evaluation of patient's response to treatment, examination of patient, ordering and performing treatments and interventions, ordering and review of laboratory studies, ordering and review of radiographic studies, pulse oximetry, re-evaluation of patient's condition, obtaining history from patient or surrogate and review of old charts   Medications Ordered in  ED Medications  LORazepam (ATIVAN) injection 1 mg (has no administration in time range)  perflutren lipid microspheres (DEFINITY) IV suspension (3 mLs Intravenous Given 11/07/20 1335)  potassium chloride SA (KLOR-CON) CR tablet 40 mEq (40 mEq Oral Given 11/07/20 0933)  albuterol (PROVENTIL,VENTOLIN) solution continuous neb (10 mg/hr Nebulization Given 11/07/20 0951)  magnesium sulfate IVPB 2 g 50 mL (0 g Intravenous Stopped 11/07/20 1036)  iohexol (OMNIPAQUE) 350 MG/ML injection 100 mL (100 mLs Intravenous Contrast Given 11/07/20 0931)  potassium chloride SA (KLOR-CON)  CR tablet 40 mEq (40 mEq Oral Given 11/07/20 1226)  verapamil (CALAN) tablet 80 mg (80 mg Oral Given 11/07/20 1512)  verapamil (CALAN) tablet 120 mg (120 mg Oral Given 11/07/20 1639)    ED Course  I have reviewed the triage vital signs and the nursing notes.  Pertinent labs & imaging results that were available during my care of the patient were reviewed by me and considered in my medical decision making (see chart for details).  This patient complains of tachycardia, shortness of breath, left arm numbness.  His involves an extensive number of treatment options, and is a complaint that carries with it a high risk of complications and morbidity.  The differential diagnosis includes asthma exacerbation versus sarcoidosis complication versus pulmonary embolism versus cervical radiculopathy versus other.  He has no objective weakness on strength testing of the left extremity.  He has a slightly higher risk of stroke due to his sarcoidosis history.  I do not think this is a large vessel occlusion.  This has been going ongoing for 3 days.  We'll get a CTH here.  If this is negative, I suspect these is an issue with cervical radiculopathy or a complication from his steroids.  He may eventually need an outpatient MRI of his cervical spine.    Also with his history of sarcoidosis and obesity, I think pulmonary embolism scan would be beneficial.  This will help us to better visualize his lungs as well, if there is an underlying pneumonia.  I reviewed his labs, mild hypoK, glucose looks okay here. He needs some nebulizers - awaiting covid test.  I would avoid additional steroids at this time, as he may be having an adverse reaction to these.  I ordered, reviewed, and interpreted labs.  No life-threatening abnormalities were noted on these tests. I ordered medication nebulizers, IV magnesium for wheezing/asthma.  No additional steroids needed at this time as he has been given large doses this week and continues  to take daily prednisone I ordered imaging studies which included CT PE I independently visualized and interpreted imaging which showed no evident PE, pericardial effusion, PTX or PNA, and the monitor tracing which showed sinus tachycardia ECG on arrival shows sinus tachycardia without acute ischemic findings  Previous records obtained and reviewed showing prior ED visits, treatment, labs  I consulted the hospitalist and discussed lab and imaging findings.  Please see consultant note.  I appreciate their input.  Echocardiogram reviewed - no acute findings, normal EF, no evidence of acute valvular disease or cardiac emergency.  With a negative troponin, I suspect his tachycardia is reactive or a baseline level, not an acute cardiac emergency.   Clinical Course as of 11/07/20 1659  Wed Nov 07, 2020  0859 EKG per my interpretation shows sinus tachycardia with a heart rate of approximately 120, no acute ST elevations.  No significant changes from prior. [MT]  0859 White blood cell count is elevated in the setting of chronic steroid use,  this likely leftward shift of steroids. [MT]  1103 His breathing has improved after his continuous nebulizers.  His heart rate is now in the 140s.   I discussed the case with the hospitalist who will consult on the patient.  Based on his work-up have a lower suspicion for ACS, PE, aortic dissection, or stroke. [MT]  1149 I spoke to Dr. Mariea Clonts hospitalist regarding patient's workup.  We agreed to obtaining an echocardiogram here in the ED.  He also advised starting the patient on verapamil for HR control (avoiding beta blockers).  We did briefly discuss an MRI scan to evaluate for cervical radiculopathy, however the patient does not have any actual objective weakness or motor deficits on exam.  If he has persistent symptoms, this can be arranged as an outpatient.  [MT]  1522 HR 130 - additional verapamil ordered, will reassess as albuterol wears off.  Prior records from  this week show a persistent sinus tachycardia on prior ED visit.  With no evidence of PE, infection, or heart disease on echo, I suspect this may be his baseline or a medication side effect.  BP has been okay here.  We will likely discharge home after another period of observation. [MT]    Clinical Course User Index [MT] Maygan Koeller, Kermit Balo, MD      Final Clinical Impression(s) / ED Diagnoses Final diagnoses:  Tachycardia  Hypokalemia  Left arm numbness    Rx / DC Orders ED Discharge Orders          Ordered    potassium chloride (KLOR-CON) 10 MEQ tablet  Daily,   Status:  Discontinued        11/07/20 1513    verapamil (CALAN) 120 MG tablet  3 times daily        11/07/20 1532    Potassium Chloride ER 20 MEQ TBCR  Daily        11/07/20 1532    Increase activity slowly        11/07/20 1532    Diet - low sodium heart healthy        11/07/20 1532    Discharge instructions       Comments: 1)Please follow-up with your ophthalmologist for further adjustments of your steroid/prednisone dose 2)Outpatient follow-up with neurologist or rheumatologist advised due to sarcoidosis with left arm numbness Follow up with Rheumatologist--- Dr. Stefano Gaul Aryal----Address: [address] [place], [place], [place] [address], Phone: [phone] 3)Please establish care with a primary care physician to coordinate the overall healthcare 4)You may need MRI of the brain and MRA of the neck in [facility] as the MRI machine here at [facility] has a weight Limit 5) please take verapamil as prescribed for blood pressure and heart rate   11/07/20 1532    Call MD for:  temperature >100.4        11/07/20 1532    Call MD for:  severe uncontrolled pain        11/07/20 1532    Call MD for:  persistant nausea and vomiting        11/07/20 1532    Call MD for:  difficulty breathing, headache or visual disturbances        11/07/20 1532    Call MD for:  persistant dizziness or light-headedness         11/07/20 1532             Terald Sleeper, MD 11/07/20 1700

## 2020-11-07 NOTE — ED Notes (Signed)
Echo informed of pt needing echo for potential discharge

## 2020-11-07 NOTE — Progress Notes (Signed)
*  PRELIMINARY RESULTS* Echocardiogram 2D Echocardiogram has been performed.  Stacey Drain 11/07/2020, 1:57 PM

## 2020-11-07 NOTE — Discharge Instructions (Signed)
You should continue taking your prednisone at this time, 40 mg, until you can talk about this dosing with your doctor.  Is also important that you look for a rheumatologist to manage your sarcoidosis moving forward.  Your left arm numbness may be due to a pinched nerve in your neck or shoulder.  If you discuss this with your doctor may want to do an MRI of your neck as an outpatient for this.

## 2020-11-07 NOTE — ED Notes (Signed)
Echo at bedside

## 2020-11-07 NOTE — ED Triage Notes (Signed)
Patient reports numbness and pain to left arm.  Also reports increased sob when lying flat and on left side with chest pressure.  Patient upon placing on the monitor was HR 140's.  Patient is tachypneic upon arrival.  Reports worsening symptoms with increased in dose of prednisone.

## 2020-11-15 ENCOUNTER — Emergency Department (HOSPITAL_COMMUNITY): Payer: Self-pay

## 2020-11-15 ENCOUNTER — Encounter (HOSPITAL_COMMUNITY): Payer: Self-pay | Admitting: Emergency Medicine

## 2020-11-15 ENCOUNTER — Ambulatory Visit
Admission: EM | Admit: 2020-11-15 | Discharge: 2020-11-15 | Disposition: A | Discharge status: Home / Self Care | Payer: Self-pay | Attending: Emergency Medicine | Admitting: Emergency Medicine

## 2020-11-15 ENCOUNTER — Other Ambulatory Visit: Payer: Self-pay

## 2020-11-15 ENCOUNTER — Emergency Department (HOSPITAL_COMMUNITY)
Admission: EM | Admit: 2020-11-15 | Discharge: 2020-11-16 | Disposition: A | Discharge status: Home / Self Care | Payer: Self-pay | Attending: Emergency Medicine | Admitting: Emergency Medicine

## 2020-11-15 ENCOUNTER — Encounter: Payer: Self-pay | Admitting: Emergency Medicine

## 2020-11-15 DIAGNOSIS — Z8616 Personal history of COVID-19: Secondary | ICD-10-CM | POA: Insufficient documentation

## 2020-11-15 DIAGNOSIS — J45909 Unspecified asthma, uncomplicated: Secondary | ICD-10-CM | POA: Insufficient documentation

## 2020-11-15 DIAGNOSIS — R042 Hemoptysis: Secondary | ICD-10-CM | POA: Insufficient documentation

## 2020-11-15 DIAGNOSIS — R059 Cough, unspecified: Secondary | ICD-10-CM

## 2020-11-15 MED ORDER — SALINE SPRAY 0.65 % NA SOLN: 1.0000 | NASAL | 0 refills | Status: DC | PRN

## 2020-11-15 NOTE — ED Provider Notes (Signed)
[facility] CARE CENTER   [id number] 11/15/20 Arrival Time: 1748  Cc: COUGH  SUBJECTIVE:  Roy Rowe is a 25 y.o. male who presents with an episode of cough up blood that occurred earlier today.  Denies sick contacts or TB exposure.  States he has been coughing more recently.  Denies alleviating or aggravating factors.  Hx significant for sarcoidosis.    Patient also complains of UE edema x few days.  Recently finished high dose steroid for sarcoidosis/ uveitis.  Has been limiting salt intake.    Reports chronic SOB  Had CT and CXR 1 week ago. Was hospitalized for respiratory failure per patient.    Denies fever, chills, fatigue, sinus pain, rhinorrhea, sore throat, wheezing, chest pain, nausea, changes in bowel or bladder habits.    ROS: As per HPI.  All other pertinent ROS negative.     Past Medical History:  Diagnosis Date   Allergic rhinitis    Asthma    Obesity (BMI 30-39.9)    Sarcoidosis    Past Surgical History:  Procedure Laterality Date   ADENOIDECTOMY     TONSILLECTOMY     Allergies  Allergen Reactions   Other Anaphylaxis   Pistachio Nut (Diagnostic) Swelling   No current facility-administered medications on file prior to encounter.   Current Outpatient Medications on File Prior to Encounter  Medication Sig Dispense Refill   acetaminophen (TYLENOL) 325 MG tablet Take 650 mg by mouth every 6 (six) hours as needed.     albuterol (VENTOLIN HFA) 108 (90 Base) MCG/ACT inhaler Inhale 1-2 puffs into the lungs every 6 (six) hours as needed for wheezing or shortness of breath.     azaTHIOprine (IMURAN) 50 MG tablet Take 50 mg by mouth daily. (Patient not taking: No sig reported)     olopatadine (PATANOL) 0.1 % ophthalmic solution Place 1 drop into the right eye 2 (two) times daily. (Patient not taking: No sig reported) 5 mL 0   Potassium Chloride ER 20 MEQ TBCR Take 20 mEq by mouth daily. 1 tab daily by mouth 5 tablet 0   predniSONE (DELTASONE) 10 MG tablet Take 40 mg  by mouth daily.     tobramycin (TOBREX) 0.3 % ophthalmic solution Place 1 drop into the right eye every 4 (four) hours. (Patient not taking: No sig reported) 5 mL 0   verapamil (CALAN) 120 MG tablet Take 1 tablet (120 mg total) by mouth 3 (three) times daily. 90 tablet 3    Social History   Socioeconomic History   Marital status: Single    Spouse name: Not on file   Number of children: Not on file   Years of education: Not on file   Highest education level: Not on file  Occupational History   Not on file  Tobacco Use   Smoking status: Never    Passive exposure: Yes   Smokeless tobacco: Never  Vaping Use   Vaping Use: Never used  Substance and Sexual Activity   Alcohol use: Not Currently    Comment: rarely   Drug use: No   Sexual activity: Never  Other Topics Concern   Not on file  Social History Narrative   Not on file   Social Determinants of Health   Financial Resource Strain: Not on file  Food Insecurity: Not on file  Transportation Needs: Not on file  Physical Activity: Not on file  Stress: Not on file  Social Connections: Not on file  Intimate Partner Violence: Not on file  Family History  Problem Relation Age of Onset   Allergic rhinitis Brother    Asthma Brother      OBJECTIVE:  Vitals:   11/15/20 1804  BP: (!) 152/101  Pulse: (!) 122  Resp: (!) 22  Temp: 98.3 F (36.8 C)  TempSrc: Oral  SpO2: 96%  Weight: (!) 456 lb 11.2 oz (207.2 kg)    General appearance: alert; well-appearing, nontoxic; speaking in full sentences and tolerating own secretions HEENT: NCAT; Ears: EACs clear, TMs pearly gray; Eyes: PERRL.  EOM grossly intact.Nose: nares patent without rhinorrhea, Throat: oropharynx clear, tonsils non erythematous or enlarged, uvula midline  Neck: supple without LAD Lungs: unlabored respirations, symmetrical air entry; cough: absent; no respiratory distress; CTAB; exam somewhat limited given body habitus Heart: regular rate and rhythm.  Skin:  warm and dry; no pitting edema Psychological: alert and cooperative; normal mood and affect    ASSESSMENT & PLAN:  1. Coughing up blood     Meds ordered this encounter  Medications   sodium chloride (OCEAN) 0.65 % SOLN nasal spray    Sig: Place 1 spray into both nostrils as needed for congestion.    Dispense:  60 mL    Refill:  0    Order Specific Question:   Supervising Provider    Answer:   Eustace Moore [[id number]]   Unable to rule out pulmonary disease, like TB, or blood clot in urgent care setting.  Offered patient further evaluation and management in the ED.  Patient declines at this time and would like to try outpatient therapy first.  Aware of the risk associated with this decision including missed diagnosis, organ damage, organ failure, and/or death.  Patient aware and in agreement.     Coughing up blood may be secondary to drainage You may have burst a superficial vessel in your esophagus with violent coughing spells We will try nasal spray Follow up with PCP for further evaluation and management Return or go to ER if you have any new or worsening symptoms such as fever, chills, fatigue, shortness of breath, wheezing, chest pain, nausea, changes in bowel or bladder habits, etc...  Reviewed expectations re: course of current medical issues. Questions answered. Outlined signs and symptoms indicating need for more acute intervention. Patient verbalized understanding. After Visit Summary given.  Additional time spent discussing CT results, and to blood work done in ED.             Rennis Harding, PA-C 11/15/20 9702

## 2020-11-15 NOTE — ED Triage Notes (Signed)
Coughed up small amount of blood today.  Also after getting steroid shot here reports he has been having edema in his arms and in thighs.

## 2020-11-15 NOTE — Discharge Instructions (Addendum)
Unable to rule out pulmonary disease, like TB, or blood clot in urgent care setting.  Offered patient further evaluation and management in the ED.  Patient declines at this time and would like to try outpatient therapy first.  Aware of the risk associated with this decision including missed diagnosis, organ damage, organ failure, and/or death.  Patient aware and in agreement.    Coughing up blood may be secondary to drainage You may have burst a superficial vessel in your esophagus with violent coughing spells We will try nasal spray Follow up with PCP for further evaluation and management Return or go to ER if you have any new or worsening symptoms such as fever, chills, fatigue, shortness of breath, wheezing, chest pain, nausea, changes in bowel or bladder habits, etc..Marland Kitchen

## 2020-11-15 NOTE — ED Triage Notes (Signed)
Pt states he coughed up a small amount of blood today. Was seen at Urgent Care and told that there was nothing they could do for him but then a friend told him that it could cause "organ failure or death" so pt came here. Pt states he feels good overall but is really anxious about what his friend said.

## 2020-11-15 NOTE — Discharge Instructions (Addendum)
You are seen here today for your cough.  Your physical exam was reassuring as was your chest x-ray.  Please follow-up with your primary care doctor as discussed.  Return to the ER with any chest pain, difficulty breathing, or any other severe symptoms.

## 2020-11-15 NOTE — ED Provider Notes (Signed)
[facility] EMERGENCY DEPARTMENT Provider Note   CSN: [id number] Arrival date & time: 11/15/20  1941     History Chief Complaint  Patient presents with   Hemoptysis    Roy Rowe is a 25 y.o. male who presents with concern for having coughed up small speck of blood, less in the size of a dime this evening.  States he presented to urgent care and they directed him to come to the emergency department.  Endorses chronic cough due to underlying asthma, usually nonproductive.  States he has not had any further production of cough since that time.  Denies fevers or chills, recent travel outside of the country, nausea, vomiting, palpitations, or chest pain.  Patient is seen in the emergency department frequently for various complaints.  Well-appearing at this time.  Describes himself as "a hypochondriac".  I personally read patient's medical records.  He has history of uveitis on chronic prednisone, morbid obesity, asthma, and sarcoidosis.   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   Retinal edema 08/24/2020   Pneumonia due to COVID-19 virus 05/23/2019   Acute hypoxemic respiratory failure (HCC) 05/23/2019   Blepharitis of left eye 03/24/2013   Hordeolum externum 03/22/2013   Morbid obesity (HCC) 02/01/2013   Allergic rhinitis 02/01/2013    Past Surgical History:  Procedure Laterality Date   ADENOIDECTOMY     TONSILLECTOMY         Family History  Problem Relation Age of Onset   Allergic rhinitis Brother    Asthma Brother     Social History   Tobacco Use    Smoking status: Never    Passive exposure: Yes   Smokeless tobacco: Never  Vaping Use   Vaping Use: Never used  Substance Use Topics   Alcohol use: Not Currently    Comment: rarely   Drug use: No    Home Medications Prior to Admission medications   Medication Sig Start Date End Date Taking? Authorizing Provider  albuterol (VENTOLIN HFA) 108 (90 Base) MCG/ACT inhaler Inhale 1-2 puffs into the lungs every 6 (six) hours as needed for wheezing or shortness of breath.   Yes [provider]  Calcium-Magnesium-Zinc [phone] MG TABS Take 1 tablet by mouth daily.   Yes [provider]  COD LIVER OIL PO Take 1 capsule by mouth daily.   Yes [provider]  milk thistle 175 MG tablet Take 175 mg by mouth daily.   Yes [provider]  predniSONE (DELTASONE) 10 MG tablet Take 30 mg by mouth daily. 10/27/20  Yes [provider]  sodium chloride (OCEAN) 0.65 % SOLN nasal spray Place 1 spray into both nostrils as needed for congestion. 11/15/20  Yes Wurst, [place], PA-C  verapamil (CALAN) 120 MG tablet Take 1 tablet (120 mg total) by mouth 3 (three) times daily. 11/07/20  Yes Emokpae, Courage, MD  azaTHIOprine (IMURAN) 50 MG tablet Take 50 mg by mouth daily. 09/06/20   [provider]  Potassium Chloride ER 20 MEQ TBCR Take 20 mEq by  mouth daily. 1 tab daily by mouth Patient not taking: Reported on 11/15/2020 11/07/20   Shon Hale, MD  tobramycin (TOBREX) 0.3 % ophthalmic solution Place 1 drop into the right eye every 4 (four) hours. 06/15/20   Moshe Cipro, NP    Allergies    Pistachio nut (diagnostic) and Grape (artificial) flavor  Review of Systems   Review of Systems  Constitutional: Negative.   HENT: Negative.    Eyes: Negative.   Respiratory:  Positive for cough.   Cardiovascular: Negative.   Gastrointestinal: Negative.   Genitourinary: Negative.   Musculoskeletal: Negative.   Neurological: Negative.    Physical Exam Updated  Vital Signs BP (!) 155/98   Pulse 100   Temp 98.4 F (36.9 C) (Oral)   Resp 18   Ht 6\' 3"  (1.905 m)   Wt (!) 206.8 kg   SpO2 97%   BMI 57.00 kg/m   Physical Exam Vitals and nursing note reviewed.  Constitutional:      Appearance: He is morbidly obese. He is not ill-appearing or toxic-appearing.  HENT:     Head: Normocephalic and atraumatic.     Nose: Nose normal.     Mouth/Throat:     Mouth: Mucous membranes are moist.     Pharynx: Oropharynx is clear. Uvula midline. No oropharyngeal exudate, posterior oropharyngeal erythema or uvula swelling.     Tonsils: No tonsillar exudate.  Eyes:     General: Lids are normal. Vision grossly intact.        Right eye: No discharge.        Left eye: No discharge.     Conjunctiva/sclera: Conjunctivae normal.     Pupils: Pupils are equal, round, and reactive to light.  Neck:     Trachea: Trachea and phonation normal.  Cardiovascular:     Rate and Rhythm: Normal rate and regular rhythm.     Pulses: Normal pulses.     Heart sounds: Normal heart sounds. No murmur heard. Pulmonary:     Effort: Pulmonary effort is normal. No tachypnea, bradypnea, accessory muscle usage, prolonged expiration or respiratory distress.     Breath sounds: Normal breath sounds. No wheezing or rales.  Chest:     Chest wall: No mass, lacerations, deformity, swelling, tenderness, crepitus or edema.  Abdominal:     General: Bowel sounds are normal. There is no distension.     Palpations: Abdomen is soft.     Tenderness: There is no abdominal tenderness. There is no right CVA tenderness, left CVA tenderness, guarding or rebound.  Musculoskeletal:        General: No deformity.     Cervical back: Normal range of motion and neck supple. No edema, rigidity or crepitus. No pain with movement or spinous process tenderness.     Right lower leg: No edema.     Left lower leg: No edema.  Lymphadenopathy:     Cervical: No cervical adenopathy.  Skin:    General: Skin is warm  and dry.  Neurological:     Mental Status: He is alert. Mental status is at baseline.  Psychiatric:        Mood and Affect: Mood normal.    ED Results / Procedures / Treatments   Labs (all labs ordered are listed, but only abnormal results are displayed) Labs Reviewed - No data to display  EKG None  Radiology DG Chest Portable 1 View  Result Date: 11/15/2020 CLINICAL DATA:  Coughing up blood once today. Sick for 3 weeks. History  of asthma and sarcoidosis. EXAM: PORTABLE CHEST 1 VIEW COMPARISON:  11/04/2020 FINDINGS: The heart size and mediastinal contours are within normal limits. Both lungs are clear. The visualized skeletal structures are unremarkable. IMPRESSION: No active disease. Electronically Signed   By: Burman Nieves M.D.   On: 11/15/2020 22:56    Procedures Procedures   Medications Ordered in ED Medications - No data to display  ED Course  I have reviewed the triage vital signs and the nursing notes.  Pertinent labs & imaging results that were available during my care of the patient were reviewed by me and considered in my medical decision making (see chart for details).    MDM Rules/Calculators/A&P                         25 year old male who is well-appearing who presents with concern for having coughed up very small speck of blood once this evening.  No other symptoms.  Hypertensive on intake, vital signs otherwise normal.  Cardiopulmonary exam is normal, abdominal exam is benign.  Patient is neurovascular intact in all 4 extremities and there is no finding on HEENT exam to suggest infection.  Chest x-ray obtained, without acute cardiopulmonary disease.  Given reassuring physical exam and vital signs, no further work-up is warranted needed this time.  Recommend he follow-up closely with his primary care doctor.  Suspect irritation of the throat or nasopharyngeal passageway as etiology for patient's symptoms. Low risk and exceedingly low suspicion for Tb or other  pulmonary infectious etiology.   20 voiced understanding of his medical evaluation and treatment plan.  His questions answered to his expressed inspection.  Return cautions given.  Patient is well-appearing, stable, and appropriate for distress time.  This chart was dictated using voice recognition software, Dragon. Despite the best efforts of this provider to proofread and correct errors, errors may still occur which can change documentation meaning.  Final Clinical Impression(s) / ED Diagnoses Final diagnoses:  None    Rx / DC Orders ED Discharge Orders     None        Sherrilee Gilles 11/15/20 2331    Terrilee Files, MD 11/16/20 [phone]

## 2020-12-13 ENCOUNTER — Ambulatory Visit (INDEPENDENT_AMBULATORY_CARE_PROVIDER_SITE_OTHER): Payer: Self-pay | Admitting: Nurse Practitioner

## 2020-12-18 ENCOUNTER — Ambulatory Visit
Admission: EM | Admit: 2020-12-18 | Discharge: 2020-12-18 | Disposition: A | Discharge status: Home / Self Care | Payer: Self-pay | Attending: Family Medicine | Admitting: Family Medicine

## 2020-12-18 ENCOUNTER — Encounter: Payer: Self-pay | Admitting: Emergency Medicine

## 2020-12-18 DIAGNOSIS — Z1152 Encounter for screening for COVID-19: Secondary | ICD-10-CM

## 2020-12-18 NOTE — ED Triage Notes (Signed)
Pt would like covid test.  GF just recently dx with covid.

## 2020-12-19 LAB — NOVEL CORONAVIRUS, NAA: SARS-CoV-2, NAA: NOT DETECTED

## 2020-12-19 LAB — SARS-COV-2, NAA 2 DAY TAT

## 2020-12-22 ENCOUNTER — Other Ambulatory Visit: Payer: Self-pay

## 2020-12-22 ENCOUNTER — Encounter (HOSPITAL_COMMUNITY): Payer: Self-pay | Admitting: Emergency Medicine

## 2020-12-22 ENCOUNTER — Emergency Department (HOSPITAL_COMMUNITY)
Admission: EM | Admit: 2020-12-22 | Discharge: 2020-12-22 | Disposition: A | Discharge status: Home / Self Care | Payer: Self-pay | Attending: Emergency Medicine | Admitting: Emergency Medicine

## 2020-12-22 DIAGNOSIS — Z20822 Contact with and (suspected) exposure to covid-19: Secondary | ICD-10-CM | POA: Insufficient documentation

## 2020-12-22 DIAGNOSIS — L989 Disorder of the skin and subcutaneous tissue, unspecified: Secondary | ICD-10-CM | POA: Insufficient documentation

## 2020-12-22 MED ORDER — CEPHALEXIN 500 MG PO CAPS: 500.0000 mg | ORAL_CAPSULE | Freq: Two times a day (BID) | ORAL | 0 refills | Status: AC

## 2020-12-22 NOTE — ED Provider Notes (Signed)
[facility] EMERGENCY DEPARTMENT Provider Note   CSN: [id number] Arrival date & time: 12/22/20  1757     History Chief Complaint  Patient presents with   Abdominal Pain    Roy Rowe is a 25 y.o. male.  HPI  25 year old male with a history of allergic rhinitis, asthma, obesity, sarcoidosis, who presents to the emergency department today for evaluation of bleeding from his bellybutton.  States that he was having some pain to his bellybutton for the last few days.  Today he noticed that he had some bleeding from his bellybutton and now the pain is resolved.  He reports he has been having some fevers recently but he attributes this to likely having COVID.  He has had a cough and congestion and states that his wife recently tested positive for COVID as well.  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   Retinal edema 08/24/2020   Pneumonia due to COVID-19 virus 05/23/2019   Acute hypoxemic respiratory failure (HCC) 05/23/2019   Blepharitis of left eye 03/24/2013   Hordeolum externum 03/22/2013   Morbid obesity (HCC) 02/01/2013   Allergic rhinitis 02/01/2013    Past Surgical History:  Procedure Laterality Date   ADENOIDECTOMY     TONSILLECTOMY         Family History  Problem Relation Age of Onset   Allergic rhinitis Brother    Asthma Brother     Social History   Tobacco Use   Smoking status: Never    Passive exposure: Yes   Smokeless tobacco: Never  Vaping Use   Vaping Use: Never used  Substance Use Topics   Alcohol use: Not Currently    Comment: rarely    Drug use: No    Home Medications Prior to Admission medications   Medication Sig Start Date End Date Taking? Authorizing Provider  cephALEXin (KEFLEX) 500 MG capsule Take 1 capsule (500 mg total) by mouth 2 (two) times daily for 7 days. 12/22/20 12/29/20 Yes Bilbo Carcamo S, PA-C  albuterol (VENTOLIN HFA) 108 (90 Base) MCG/ACT inhaler Inhale 1-2 puffs into the lungs every 6 (six) hours as needed for wheezing or shortness of breath.    [provider]  azaTHIOprine (IMURAN) 50 MG tablet Take 50 mg by mouth daily. 09/06/20   [provider]  Calcium-Magnesium-Zinc [phone] MG TABS Take 1 tablet by mouth daily.    [provider]  COD LIVER OIL PO Take 1 capsule by mouth daily.    [provider]  milk thistle 175 MG tablet Take 175 mg by mouth daily.    [provider]  Potassium Chloride ER 20 MEQ TBCR Take 20 mEq by mouth daily. 1 tab daily by mouth Patient not taking: Reported on 11/15/2020 11/07/20   Shon Hale, MD  predniSONE (DELTASONE) 10 MG tablet Take 30 mg by mouth daily. 10/27/20   [provider]  sodium chloride (OCEAN) 0.65 % SOLN nasal spray Place 1 spray into both nostrils as needed for congestion. 11/15/20   Wurst, [place], PA-C  tobramycin (TOBREX) 0.3 %  ophthalmic solution Place 1 drop into the right eye every 4 (four) hours. 06/15/20   Moshe Cipro, NP  verapamil (CALAN) 120 MG tablet Take 1 tablet (120 mg total) by mouth 3 (three) times daily. 11/07/20   Shon Hale, MD    Allergies    Pistachio nut (diagnostic) and Grape (artificial) flavor  Review of Systems   Review of Systems  Constitutional:  Positive for fever.  HENT:  Positive for congestion. Negative for rhinorrhea.   Respiratory:  Positive for cough.   Gastrointestinal:  Positive for abdominal distention (pain to umbilicus).  Skin:  Positive for wound.   Physical Exam Updated Vital Signs BP (!) 147/103   Pulse (!) 116   Temp 97.8 F (36.6  C) (Oral)   Resp 18   Ht 6\' 3"  (1.905 m)   Wt (!) 202.8 kg   SpO2 98%   BMI 55.90 kg/m   Physical Exam Constitutional:      General: He is not in acute distress.    Appearance: He is well-developed.  Eyes:     Conjunctiva/sclera: Conjunctivae normal.  Cardiovascular:     Rate and Rhythm: Normal rate.  Pulmonary:     Effort: Pulmonary effort is normal.  Abdominal:     Tenderness: There is no abdominal tenderness.     Comments: There is some dried blood to the umbilicus. The area was inspected and Do not see any signs of any active bleeding.  I am not palpating any area of fluctuance, induration and his abdomen is soft and nontender  Skin:    General: Skin is warm and dry.  Neurological:     Mental Status: He is alert and oriented to person, place, and time.    ED Results / Procedures / Treatments   Labs (all labs ordered are listed, but only abnormal results are displayed) Labs Reviewed - No data to display  EKG None  Radiology No results found.  Procedures Procedures   Medications Ordered in ED Medications - No data to display  ED Course  I have reviewed the triage vital signs and the nursing notes.  Pertinent labs & imaging results that were available during my care of the patient were reviewed by me and considered in my medical decision making (see chart for details).    MDM Rules/Calculators/A&P                          25 year old male presenting with bleeding to the umbilicus that started today.  Has had some pain to the area for the last few days. On exam There is some dried blood to the umbilicus. The area was inspected and I Do not see any signs of any active bleeding.  I am not palpating any area of fluctuance, induration and his abdomen is soft and nontender.  I suspect that he may have had a small abscess.  He has had some fevers at home which are most likely due to COVID however given his body habitus, we will treat with a course of antibiotics to  help prevent any further infection.  I did offer COVID testing patient declined.  Advised on follow-up and strict return precautions.  He was understanding the plan and reasons to return.  All Questions answered.  Patient stable for discharge.   Final Clinical Impression(s) / ED Diagnoses Final diagnoses:  Skin problem    Rx / DC Orders ED Discharge Orders  Ordered    cephALEXin (KEFLEX) 500 MG capsule  2 times daily        12/22/20 1835             Rayne Du 12/22/20 1850    Eber Hong, MD 12/23/20 [phone]

## 2020-12-22 NOTE — Discharge Instructions (Addendum)
You were given a prescription for antibiotics. Please take the antibiotic prescription fully.   Please follow up with your primary care provider within 5-7 days for re-evaluation of your symptoms. If you do not have a primary care provider, information for a healthcare clinic has been provided for you to make arrangements for follow up care. Please return to the emergency department for any new or worsening symptoms.  

## 2020-12-22 NOTE — ED Triage Notes (Signed)
Pt c/o blood "spewing from belly button" but states he has had abdominal pain x3 days as well as mild constipation. Bleeding controlled at this time.

## 2021-02-09 ENCOUNTER — Encounter: Payer: Self-pay | Admitting: Emergency Medicine

## 2021-02-09 ENCOUNTER — Ambulatory Visit
Admission: EM | Admit: 2021-02-09 | Discharge: 2021-02-09 | Disposition: A | Discharge status: Home / Self Care | Payer: Self-pay

## 2021-02-09 DIAGNOSIS — H44119 Panuveitis, unspecified eye: Secondary | ICD-10-CM | POA: Insufficient documentation

## 2021-02-09 DIAGNOSIS — Z760 Encounter for issue of repeat prescription: Secondary | ICD-10-CM

## 2021-02-09 HISTORY — DX: Panuveitis, unspecified eye: H44.119

## 2021-02-09 MED ORDER — VERAPAMIL HCL 120 MG PO TABS
120.0000 mg | ORAL_TABLET | Freq: Three times a day (TID) | ORAL | 0 refills | Status: DC
Start: 2021-02-09 — End: 2023-10-06

## 2021-02-09 NOTE — ED Triage Notes (Signed)
Pt presents today requesting refill for Prednisone and Verapamil. He reports having an eye disease called "Panuveitis" and he missed his recent appt due to illness.

## 2021-02-09 NOTE — ED Provider Notes (Signed)
[facility]    CSN: [id number] Arrival date & time: 02/09/21  1140      History   Chief Complaint Chief Complaint  Patient presents with   Medication Refill    HPI Roy Rowe is a 25 y.o. male.   HPI Patient with a medical history significant of morbid obesity, hypertension, panuvetitis, and sarcoidosis   Presents today for medication refill.  Patient is followed by [facility] in [place] and missed his follow-up appointment in September.  According to medical records patient is prescribed Humira which he reports he is not started due to paperwork issues and getting the medication approved.  He is in today requesting a refill of his verapamil and prednisone.  However on review of EMR do not see a prescription for prednisone prescribed since June.  Patient reports he contacted his ophthalmologist and they agreed to send over prescription however according to EMR I am unable to see any recent notes indicating refill of prednisone. 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 Authorizing Provider  dorzolamide-timolol (COSOPT) 22.3-6.8 MG/ML ophthalmic solution 1 drop 2 (two) times daily.   Yes [provider]  albuterol (VENTOLIN HFA) 108 (90 Base) MCG/ACT inhaler Inhale 1-2 puffs into the lungs every 6 (six) hours as needed for wheezing or shortness of breath.    [provider]  azaTHIOprine (IMURAN) 50 MG tablet Take 50 mg by mouth daily. 09/06/20   [provider]  Calcium-Magnesium-Zinc [phone] MG TABS Take 1 tablet by mouth daily.    [provider]  COD LIVER OIL PO Take 1 capsule by mouth daily.    [provider]  milk thistle 175 MG tablet Take 175 mg by mouth daily.    [provider]  Potassium Chloride ER 20 MEQ TBCR Take 20 mEq by mouth daily. 1 tab daily by mouth Patient not taking: Reported on 11/15/2020 11/07/20   Shon Hale, MD  predniSONE (DELTASONE) 10 MG tablet Take 30 mg by mouth daily. 10/27/20   [provider]  sodium chloride (OCEAN) 0.65 % SOLN nasal spray Place 1 spray into both nostrils as needed for congestion. 11/15/20   Wurst, [place], PA-C  tobramycin (TOBREX) 0.3 % ophthalmic solution Place 1 drop into the right eye every 4 (four) hours. 06/15/20   Moshe Cipro, NP  verapamil (CALAN) 120 MG tablet Take 1 tablet (120 mg total) by mouth 3 (three) times daily. 02/09/21   Bing Neighbors, FNP  Family History Family History  Problem Relation Age of Onset   Allergic rhinitis Brother    Asthma Brother     Social History Social History   Tobacco Use   Smoking status: Never    Passive exposure: Yes   Smokeless tobacco: Never  Vaping Use   Vaping Use: Never used  Substance Use Topics   Alcohol use: Not Currently    Comment: rarely   Drug use: No     Allergies   Pistachio nut (diagnostic) and Grape (artificial) flavor   Review of Systems Review of Systems   Physical Exam Triage Vital Signs ED Triage Vitals  Enc Vitals Group     BP  02/09/21 1217 (!) 174/119     Pulse Rate 02/09/21 1217 100     Resp 02/09/21 1217 18     Temp 02/09/21 1217 98.9 F (37.2 C)     Temp Source 02/09/21 1217 Oral     SpO2 02/09/21 1217 98 %     Weight --      Height --      Head Circumference --      Peak Flow --      Pain Score 02/09/21 1218 0     Pain Loc --      Pain Edu? --      Excl. in GC? --    No data found.  Updated Vital Signs BP (!) 174/119 (BP Location: Right Arm)   Pulse 100   Temp 98.9 F (37.2 C) (Oral)   Resp 18   SpO2 98%   Visual Acuity Right Eye Distance:   Left Eye Distance:   Bilateral Distance:    Right Eye Near:   Left Eye Near:    Bilateral Near:     Physical Exam Constitutional:      Appearance: He is obese.  HENT:     Head: Normocephalic.  Cardiovascular:     Rate and Rhythm: Normal rate.  Pulmonary:     Effort: Pulmonary effort is normal.  Skin:    General: Skin is warm.  Neurological:     General: No focal deficit present.  Psychiatric:        Mood and Affect: Mood normal.        Behavior: Behavior normal.        Thought Content: Thought content normal.        Judgment: Judgment normal.     UC Treatments / Results  Labs (all labs ordered are listed, but only abnormal results are displayed) Labs Reviewed - No data to display  EKG   Radiology No results found.  Procedures Procedures (including critical care time)  Medications Ordered in UC Medications - No data to display  Initial Impression / Assessment and Plan / UC Course  I have reviewed the triage vital signs and the nursing notes.  Pertinent labs & imaging results that were available during my care of the patient were reviewed by me and considered in my medical decision making (see chart for details).    Agreed to refill verapamil for management of hypertension Patient advised to follow-up with ophthalmology office on Monday or contact their office and speak with after-hours service to request prednisone.   Given patient is followed by ophthalmology specialty for chronic eye disease this is something that should not be managed in the setting of urgent care. Final Clinical Impressions(s) / UC Diagnoses   Final diagnoses:  Encounter for medication refill   Discharge Instructions   None  ED Prescriptions     Medication Sig Dispense Auth. Provider   verapamil (CALAN) 120 MG tablet Take 1 tablet (120 mg total) by mouth 3 (three) times daily. 90 tablet Bing Neighbors, FNP      PDMP not reviewed this encounter.   Bing Neighbors, FNP 02/09/21 1328

## 2021-02-28 ENCOUNTER — Encounter (HOSPITAL_COMMUNITY): Payer: Self-pay

## 2021-02-28 ENCOUNTER — Emergency Department (HOSPITAL_COMMUNITY)
Admission: EM | Admit: 2021-02-28 | Discharge: 2021-02-28 | Disposition: A | Discharge status: Home / Self Care | Payer: Self-pay | Attending: Emergency Medicine | Admitting: Emergency Medicine

## 2021-02-28 ENCOUNTER — Other Ambulatory Visit: Payer: Self-pay

## 2021-02-28 DIAGNOSIS — Z5321 Procedure and treatment not carried out due to patient leaving prior to being seen by health care provider: Secondary | ICD-10-CM | POA: Insufficient documentation

## 2021-02-28 DIAGNOSIS — R2240 Localized swelling, mass and lump, unspecified lower limb: Secondary | ICD-10-CM | POA: Insufficient documentation

## 2021-02-28 DIAGNOSIS — R2230 Localized swelling, mass and lump, unspecified upper limb: Secondary | ICD-10-CM | POA: Insufficient documentation

## 2021-02-28 NOTE — ED Triage Notes (Signed)
Pt reports arm, hand and leg swelling x 2 weeks. Pt denies chest pain at this time. Pt says he started have some sob today. Pt able to speak full sentences, no distress and 98% O2 on room air.

## 2021-03-04 ENCOUNTER — Emergency Department (HOSPITAL_COMMUNITY)
Admission: EM | Admit: 2021-03-04 | Discharge: 2021-03-04 | Disposition: A | Discharge status: Home / Self Care | Payer: Self-pay | Attending: Emergency Medicine | Admitting: Emergency Medicine

## 2021-03-04 ENCOUNTER — Encounter (HOSPITAL_COMMUNITY): Payer: Self-pay | Admitting: *Deleted

## 2021-03-04 ENCOUNTER — Other Ambulatory Visit: Payer: Self-pay

## 2021-03-04 DIAGNOSIS — R58 Hemorrhage, not elsewhere classified: Secondary | ICD-10-CM | POA: Insufficient documentation

## 2021-03-04 DIAGNOSIS — Z7722 Contact with and (suspected) exposure to environmental tobacco smoke (acute) (chronic): Secondary | ICD-10-CM | POA: Insufficient documentation

## 2021-03-04 DIAGNOSIS — Z8616 Personal history of COVID-19: Secondary | ICD-10-CM | POA: Insufficient documentation

## 2021-03-04 DIAGNOSIS — J45909 Unspecified asthma, uncomplicated: Secondary | ICD-10-CM | POA: Insufficient documentation

## 2021-03-04 MED ORDER — CEPHALEXIN 500 MG PO CAPS
500.0000 mg | ORAL_CAPSULE | Freq: Once | ORAL | Status: AC
  Administered 2021-03-04: 500 mg via ORAL
  Filled 2021-03-04: qty 1

## 2021-03-04 MED ORDER — NYSTATIN 100000 UNIT/GM EX POWD: 1.0000 "application " | Freq: Three times a day (TID) | CUTANEOUS | 0 refills | Status: DC

## 2021-03-04 MED ORDER — CEPHALEXIN 500 MG PO CAPS: 500.0000 mg | ORAL_CAPSULE | Freq: Four times a day (QID) | ORAL | 0 refills | Status: DC

## 2021-03-04 NOTE — ED Triage Notes (Signed)
Pt is on prednisone for a year.

## 2021-03-04 NOTE — ED Triage Notes (Signed)
Pt with bleeding from umbilical area, pt also c/o generalized swelling and hard time urinating-decrease in volume and pressure.   Last voided this morning.

## 2021-03-04 NOTE — ED Provider Notes (Signed)
[facility] EMERGENCY DEPARTMENT Provider Note   CSN: [id number] Arrival date & time: 03/04/21  1815     History No chief complaint on file.   Roy Rowe is a 25 y.o. male.  Patient is a 25 year old male with history of morbid obesity, panuveitis, sarcoidosis.  Patient presenting today with complaints of bleeding from his bellybutton.  For the past 2 days, he has noted bloody discharge coming from his umbilicus.  He denies that he is having any discomfort or pain.  He denies any bowel or bladder complaints.  Other complaint is of generalized swelling that he feels may be related to taking prednisone chronically for his uveitis.  He denies any chest pain or difficulty breathing.  He denies any fevers or chills.  The history is provided by the patient.      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   Retinal edema 08/24/2020   Pneumonia due to COVID-19 virus 05/23/2019   Acute hypoxemic respiratory failure (HCC) 05/23/2019   Blepharitis of left eye 03/24/2013   Hordeolum externum 03/22/2013   Morbid obesity (HCC) 02/01/2013   Allergic rhinitis 02/01/2013    Past Surgical History:  Procedure Laterality Date   ADENOIDECTOMY     TONSILLECTOMY         Family History  Problem Relation Age of Onset   Allergic rhinitis Brother    Asthma Brother     Social History   Tobacco Use   Smoking status: Never    Passive exposure: Yes   Smokeless tobacco: Never  Vaping Use   Vaping Use: Never used  Substance Use Topics   Alcohol use:  Not Currently    Comment: rarely   Drug use: No    Home Medications Prior to Admission medications   Medication Sig Start Date End Date Taking? Authorizing Provider  albuterol (VENTOLIN HFA) 108 (90 Base) MCG/ACT inhaler Inhale 1-2 puffs into the lungs every 6 (six) hours as needed for wheezing or shortness of breath.    [provider]  azaTHIOprine (IMURAN) 50 MG tablet Take 50 mg by mouth daily. 09/06/20   [provider]  Calcium-Magnesium-Zinc [phone] MG TABS Take 1 tablet by mouth daily.    [provider]  COD LIVER OIL PO Take 1 capsule by mouth daily.    [provider]  dorzolamide-timolol (COSOPT) 22.3-6.8 MG/ML ophthalmic solution 1 drop 2 (two) times daily.    [provider]  milk thistle 175 MG tablet Take 175 mg by mouth daily.    [provider]  Potassium Chloride ER 20 MEQ TBCR Take 20 mEq by mouth daily. 1 tab daily by mouth Patient not taking: Reported on 11/15/2020 11/07/20   Shon Hale, MD  predniSONE (DELTASONE) 10 MG tablet Take 30 mg by mouth daily. 10/27/20   [provider]  sodium chloride (OCEAN) 0.65 % SOLN nasal spray Place 1 spray into both nostrils as needed for congestion. 11/15/20   Wurst, [place], PA-C  tobramycin (TOBREX) 0.3 % ophthalmic  solution Place 1 drop into the right eye every 4 (four) hours. 06/15/20   Moshe Cipro, NP  verapamil (CALAN) 120 MG tablet Take 1 tablet (120 mg total) by mouth 3 (three) times daily. 02/09/21   Bing Neighbors, FNP    Allergies    Pistachio nut (diagnostic) and Grape (artificial) flavor  Review of Systems   Review of Systems  All other systems reviewed and are negative.  Physical Exam Updated Vital Signs BP (!) 142/83   Pulse 96   Temp 98.2 F (36.8 C) (Oral)   Resp 19   Ht 6\' 2"  (1.88 m)   Wt (!) 193.7 kg   SpO2 92%   BMI 54.84 kg/m   Physical Exam Vitals and nursing note reviewed.  Constitutional:      General: He is not in  acute distress.    Appearance: He is well-developed. He is not diaphoretic.  HENT:     Head: Normocephalic and atraumatic.  Cardiovascular:     Rate and Rhythm: Normal rate and regular rhythm.     Heart sounds: No murmur heard.   No friction rub.  Pulmonary:     Effort: Pulmonary effort is normal. No respiratory distress.     Breath sounds: Normal breath sounds. No wheezing or rales.  Abdominal:     General: Bowel sounds are normal. There is no distension.     Palpations: Abdomen is soft.     Tenderness: There is no abdominal tenderness.     Comments: Abdomen is morbidly obese.  The umbilicus appears normal as far as I can see.  A Q-tip does reveal a bloody discharge, but no active bleeding.  Musculoskeletal:        General: Normal range of motion.     Cervical back: Normal range of motion and neck supple.  Skin:    General: Skin is warm and dry.  Neurological:     Mental Status: He is alert and oriented to person, place, and time.     Coordination: Coordination normal.    ED Results / Procedures / Treatments   Labs (all labs ordered are listed, but only abnormal results are displayed) Labs Reviewed - No data to display  EKG None  Radiology No results found.  Procedures Procedures   Medications Ordered in ED Medications  cephALEXin (KEFLEX) capsule 500 mg (has no administration in time range)    ED Course  I have reviewed the triage vital signs and the nursing notes.  Pertinent labs & imaging results that were available during my care of the patient were reviewed by me and considered in my medical decision making (see chart for details).    MDM Rules/Calculators/A&P  Patient presenting here with complaints as described in the HPI.  A Q-tip does reveal a slight amount of blood, but no purulent material or palpable abnormality.  Upon inspecting the umbilicus, I see no obvious abnormal tissue or source of bleeding.  I suspect he may have a small cyst or possibly a  candidal type infection in this area as there is redundant skin/adipose tissue.  Patient to be treated with antibiotics in case there is a deep abscess and also with nystatin powder in case this is a candidal infection.  He has an appointment upcoming this Thursday with his primary doctor.  I believe he can safely be follow-ed up then.  I do not feel as though any imaging studies or laboratory studies are indicated at this time.  Final Clinical Impression(s) /  ED Diagnoses Final diagnoses:  None    Rx / DC Orders ED Discharge Orders     None        Geoffery Lyons, MD 03/04/21 [phone]

## 2021-03-04 NOTE — Discharge Instructions (Signed)
Begin taking Keflex as prescribed.  Apply nystatin powder to the umbilicus as directed.  Follow-up with your primary doctor on Thursday as previously scheduled.  Return to the ER if symptoms significantly worsen or change.

## 2021-03-09 ENCOUNTER — Emergency Department: Payer: Self-pay

## 2021-03-09 ENCOUNTER — Encounter: Payer: Self-pay | Admitting: Radiology

## 2021-03-09 ENCOUNTER — Other Ambulatory Visit: Payer: Self-pay

## 2021-03-09 DIAGNOSIS — R0602 Shortness of breath: Secondary | ICD-10-CM | POA: Insufficient documentation

## 2021-03-09 DIAGNOSIS — R1033 Periumbilical pain: Secondary | ICD-10-CM | POA: Insufficient documentation

## 2021-03-09 DIAGNOSIS — Z7722 Contact with and (suspected) exposure to environmental tobacco smoke (acute) (chronic): Secondary | ICD-10-CM | POA: Insufficient documentation

## 2021-03-09 DIAGNOSIS — Z8616 Personal history of COVID-19: Secondary | ICD-10-CM | POA: Insufficient documentation

## 2021-03-09 DIAGNOSIS — D72829 Elevated white blood cell count, unspecified: Secondary | ICD-10-CM | POA: Insufficient documentation

## 2021-03-09 DIAGNOSIS — J45909 Unspecified asthma, uncomplicated: Secondary | ICD-10-CM | POA: Insufficient documentation

## 2021-03-09 LAB — CBC WITH DIFFERENTIAL/PLATELET
Abs Immature Granulocytes: 0.06 10*3/uL (ref 0.00–0.07)
Basophils Absolute: 0.1 10*3/uL (ref 0.0–0.1)
Basophils Relative: 0 %
Eosinophils Absolute: 0 10*3/uL (ref 0.0–0.5)
Eosinophils Relative: 0 %
HCT: 50.6 % (ref 39.0–52.0)
Hemoglobin: 18.3 g/dL — ABNORMAL HIGH (ref 13.0–17.0)
Immature Granulocytes: 1 %
Lymphocytes Relative: 13 %
Lymphs Abs: 1.6 10*3/uL (ref 0.7–4.0)
MCH: 31.2 pg (ref 26.0–34.0)
MCHC: 36.2 g/dL — ABNORMAL HIGH (ref 30.0–36.0)
MCV: 86.3 fL (ref 80.0–100.0)
Monocytes Absolute: 0.6 10*3/uL (ref 0.1–1.0)
Monocytes Relative: 5 %
Neutro Abs: 9.9 10*3/uL — ABNORMAL HIGH (ref 1.7–7.7)
Neutrophils Relative %: 81 %
Platelets: 236 10*3/uL (ref 150–400)
RBC: 5.86 MIL/uL — ABNORMAL HIGH (ref 4.22–5.81)
RDW: 13 % (ref 11.5–15.5)
WBC: 12.1 10*3/uL — ABNORMAL HIGH (ref 4.0–10.5)
nRBC: 0 % (ref 0.0–0.2)

## 2021-03-09 LAB — BASIC METABOLIC PANEL
Anion gap: 11 (ref 5–15)
BUN: 13 mg/dL (ref 6–20)
CO2: 24 mmol/L (ref 22–32)
Calcium: 9.9 mg/dL (ref 8.9–10.3)
Chloride: 101 mmol/L (ref 98–111)
Creatinine, Ser: 0.99 mg/dL (ref 0.61–1.24)
GFR, Estimated: >60 mL/min (ref >60)
Glucose, Bld: 151 mg/dL — ABNORMAL HIGH (ref 70–99)
Potassium: 3.7 mmol/L (ref 3.5–5.1)
Sodium: 136 mmol/L (ref 135–145)

## 2021-03-09 LAB — TROPONIN I (HIGH SENSITIVITY): Troponin I (High Sensitivity): 3 ng/L (ref <18)

## 2021-03-09 NOTE — ED Triage Notes (Signed)
C/o increased SOB, work of breathing x2 days. Especially when standing. Hx. Of asthma and sarcoidosis. No meds.

## 2021-03-10 ENCOUNTER — Emergency Department
Admission: EM | Admit: 2021-03-10 | Discharge: 2021-03-10 | Disposition: A | Discharge status: Home / Self Care | Payer: Self-pay | Attending: Student in an Organized Health Care Education/Training Program | Admitting: Student in an Organized Health Care Education/Training Program

## 2021-03-10 ENCOUNTER — Emergency Department: Payer: Self-pay

## 2021-03-10 DIAGNOSIS — R1033 Periumbilical pain: Secondary | ICD-10-CM

## 2021-03-10 NOTE — ED Provider Notes (Signed)
[facility] Emergency Department Provider Note    Event Date/Time   First MD Initiated Contact with Patient 03/10/21 [phone]     (approximate)  I have reviewed the triage vital signs and the nursing notes.   HISTORY  Chief Complaint Shortness of Breath (C/o increased SOB, work of breathing x2 days. Especially when standing. Hx. Of asthma and sarcoidosis. No meds.) and Wound Check (C/o bleeding from belly button x 5 days. Pt seen and prescribed nystatin powder for it.)    HPI Roy Rowe is a 25 y.o. male with below listed past medical history presents to the ER for evaluation of some periumbilical discomfort as well as drainage.  Was just recently seen in the ER and by PCP diagnosed with possible cellulitis or candidal infection.  Does not feel like it is getting worse but has not gotten better.  Has had issues with this in the past but this episode is more severe and lasting longer.  States he also had an episode of shortness of breath when he was walking to the hospital but denies any shortness of breath right now.  He is declining COVID flu RSV testing.  Is on chronic steroid for panuveitis.  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 Authorizing Provider  albuterol (VENTOLIN HFA) 108 (90 Base) MCG/ACT inhaler Inhale 1-2 puffs into the lungs every 6 (six) hours as needed for wheezing or shortness of breath.    [provider]  azaTHIOprine (IMURAN) 50 MG tablet Take 50 mg by mouth daily. 09/06/20   [provider]  Calcium-Magnesium-Zinc [phone] MG TABS Take 1 tablet by mouth daily.    [provider]  cephALEXin (KEFLEX) 500 MG capsule Take 1 capsule (500 mg total) by mouth 4 (four) times daily. 03/04/21   Geoffery Lyons, MD  COD LIVER OIL PO Take 1 capsule by mouth daily.    [provider]  dorzolamide-timolol (COSOPT) 22.3-6.8 MG/ML ophthalmic solution 1 drop 2 (two) times daily.    [provider]  milk thistle 175 MG tablet Take 175 mg by mouth daily.    [provider]  nystatin (MYCOSTATIN/NYSTOP) powder Apply 1 application topically 3 (three) times daily. 03/04/21   Geoffery Lyons, MD  Potassium Chloride ER 20 MEQ TBCR Take 20 mEq by mouth daily. 1 tab daily by mouth Patient not taking: Reported on 11/15/2020 11/07/20   Shon Hale, MD  predniSONE (DELTASONE) 10 MG tablet Take 30 mg by mouth daily. 10/27/20   [provider]  sodium chloride (OCEAN) 0.65 % SOLN nasal spray Place 1 spray into both nostrils as needed for congestion. 11/15/20   Wurst, [place], PA-C  tobramycin (TOBREX) 0.3 % ophthalmic solution Place 1 drop into the right eye every 4 (four) hours. 06/15/20   Moshe Cipro, NP  verapamil (CALAN) 120 MG tablet Take 1 tablet (120 mg total) by mouth 3 (three) times daily. 02/09/21   Bing Neighbors, FNP    Allergies Pistachio nut (diagnostic) and Grape (artificial) flavor    Social  History Social History   Tobacco Use   Smoking status: Never    Passive exposure: Yes   Smokeless tobacco: Never  Vaping Use   Vaping Use: Never used  Substance Use Topics   Alcohol use: Not Currently    Comment: rarely   Drug use: No    Review of Systems Patient denies headaches, rhinorrhea, blurry vision, numbness, shortness of breath, chest pain, edema, cough, abdominal pain, nausea, vomiting, diarrhea, dysuria, fevers, rashes or hallucinations unless otherwise stated above in HPI. ____________________________________________   PHYSICAL EXAM:  VITAL SIGNS: Vitals:   03/10/21 0526 03/10/21 0526  BP:  (!) 142/95  Pulse:  83  Resp: 20 20  Temp:  97.7 F (36.5 C)  SpO2:  93%    Constitutional: Alert and oriented.  Eyes: Conjunctivae are normal.  Head: Atraumatic. Nose: No congestion/rhinnorhea. Mouth/Throat: Mucous membranes are moist.   Neck: No stridor. Painless ROM.  Cardiovascular: Normal rate, regular rhythm. Grossly normal heart sounds.  Good peripheral circulation. Respiratory: Normal respiratory effort.  No retractions. Lungs CTAB. Gastrointestinal: Soft and nontender.  Small mount of dried blood in the umbilical area with some umbilical erythema no fluctuance or abscess.  Soft powdery residue mixed with a Q-tip particles and hair removed from umbilicus no active drainage or purulence at this time.  No distention. No abdominal bruits. No CVA tenderness. Genitourinary:  Musculoskeletal: No lower extremity tenderness nor edema.  No joint effusions. Neurologic:  Normal speech and language. No gross focal neurologic deficits are appreciated. No facial droop Skin:  Skin is warm, dry and intact. No rash noted. Psychiatric: Mood and affect are normal. Speech and behavior are normal.  ____________________________________________   LABS (all labs ordered are listed, but only abnormal results are displayed)  Results for orders placed or performed during the hospital  encounter of 03/10/21 (from the past 24 hour(s))  Basic metabolic panel     Status: Abnormal   Collection Time: 03/09/21  8:44 PM  Result Value Ref Range   Sodium 136 135 - 145 mmol/L   Potassium 3.7 3.5 - 5.1 mmol/L   Chloride 101 98 - 111 mmol/L   CO2 24 22 - 32 mmol/L   Glucose, Bld 151 (H) 70 - 99 mg/dL   BUN 13 6 - 20 mg/dL   Creatinine, Ser 5.95 0.61 - 1.24 mg/dL   Calcium 9.9 8.9 - 63.8 mg/dL   GFR, Estimated >75 >64 mL/min   Anion gap 11 5 - 15  CBC with Differential     Status: Abnormal   Collection Time: 03/09/21  8:51 PM  Result Value Ref Range   WBC 12.1 (H) 4.0 - 10.5 K/uL   RBC 5.86 (H) 4.22 - 5.81 MIL/uL   Hemoglobin 18.3 (H) 13.0 - 17.0 g/dL   HCT 33.2 95.1 - 88.4 %   MCV 86.3 80.0 - 100.0 fL   MCH  31.2 26.0 - 34.0 pg   MCHC 36.2 (H) 30.0 - 36.0 g/dL   RDW 70.9 29.5 - 74.7 %   Platelets 236 150 - 400 K/uL   nRBC 0.0 0.0 - 0.2 %   Neutrophils Relative % 81 %   Neutro Abs 9.9 (H) 1.7 - 7.7 K/uL   Lymphocytes Relative 13 %   Lymphs Abs 1.6 0.7 - 4.0 K/uL   Monocytes Relative 5 %   Monocytes Absolute 0.6 0.1 - 1.0 K/uL   Eosinophils Relative 0 %   Eosinophils Absolute 0.0 0.0 - 0.5 K/uL   Basophils Relative 0 %   Basophils Absolute 0.1 0.0 - 0.1 K/uL   Immature Granulocytes 1 %   Abs Immature Granulocytes 0.06 0.00 - 0.07 K/uL  Troponin I (High Sensitivity)     Status: None   Collection Time: 03/09/21  8:51 PM  Result Value Ref Range   Troponin I (High Sensitivity) 3 <18 ng/L   ____________________________________________  EKG My review and personal interpretation at Time: 20:38   Indication: abd pain  Rate: 110  Rhythm: sinus Axis: normal Other: normal intervals, no stemi, no depressions ____________________________________________  RADIOLOGY  I personally reviewed all radiographic images ordered to evaluate for the above acute complaints and reviewed radiology reports and findings.  These findings were personally discussed with the patient.  Please  see medical record for radiology report.  ____________________________________________   PROCEDURES  Procedure(s) performed:  Procedures    Critical Care performed: no ____________________________________________   INITIAL IMPRESSION / ASSESSMENT AND PLAN / ED COURSE  Pertinent labs & imaging results that were available during my care of the patient were reviewed by me and considered in my medical decision making (see chart for details).   DDX: Cellulitis, abscess, hernia, obstruction, wound dehiscence, abrasion, asthma, COPD, pneumonia, pneumothorax, PE, CHF  Roy Rowe is a 25 y.o. who presents to the ED with presentation as described above.  Primary complaint seems to be persistent bleeding and irritation in his umbilicus.  He is denying any shortness of breath at this time.  His exam is reassuring.  Chest x-ray without pneumothorax or infiltrate.  Do not appreciate any significant wheezing.  As he is currently asymptomatic from that I have a lower suspicion for asthma exacerbation and PE or CHF.  Patient declining COVID or flu testing.  Does have mild leukocytosis.  No anemia.  Based on duration of symptoms already on antibiotics as he is on chronic steroids recommended CT imaging to further evaluate.  Patient agreeable to plan.  Clinical Course as of 03/10/21 1036  Sun Mar 10, 2021  3403 With speculum was able to identify white powdery substance in the navel which was irrigated and removed.  Consistent with combination of hair Q-tip particles and nystatin powder [PR]  1034 Patient reassessed.  Remains well-appearing and nontoxic.  CT imaging reassuring.  He denies any chest pain or shortness of breath.  Does appear stable and appropriate for outpatient follow-up.  We discussed conservative management and signs and symptoms for which she should return to the ER. [PR]    Clinical Course User Index [PR] Willy Eddy, MD    The patient was evaluated in Emergency Department  today for the symptoms described in the history of present illness. He/she was evaluated in the context of the global COVID-19 pandemic, which necessitated consideration that the patient might be at risk for infection with the SARS-CoV-2 virus that causes COVID-19. Institutional protocols and algorithms that pertain to the  evaluation of patients at risk for COVID-19 are in a state of rapid change based on information released by regulatory bodies including the CDC and federal and state organizations. These policies and algorithms were followed during the patient's care in the ED.  As part of my medical decision making, I reviewed the following data within the electronic MEDICAL RECORD NUMBER Nursing notes reviewed and incorporated, Labs reviewed, notes from prior ED visits and [place] Controlled Substance Database   ____________________________________________   FINAL CLINICAL IMPRESSION(S) / ED DIAGNOSES  Final diagnoses:  Umbilical pain      NEW MEDICATIONS STARTED DURING THIS VISIT:  New Prescriptions   No medications on file     Note:  This document was prepared using Dragon voice recognition software and may include unintentional dictation errors.    Willy Eddy, MD 03/10/21 [phone]

## 2021-03-10 NOTE — Discharge Instructions (Signed)

## 2021-05-09 ENCOUNTER — Other Ambulatory Visit: Payer: Self-pay | Admitting: Family Medicine

## 2021-12-13 ENCOUNTER — Ambulatory Visit
Admission: EM | Admit: 2021-12-13 | Discharge: 2021-12-13 | Disposition: A | Discharge status: Home / Self Care | Payer: Self-pay

## 2021-12-13 ENCOUNTER — Other Ambulatory Visit: Payer: Self-pay

## 2021-12-13 ENCOUNTER — Encounter: Payer: Self-pay | Admitting: Emergency Medicine

## 2021-12-13 DIAGNOSIS — K529 Noninfective gastroenteritis and colitis, unspecified: Secondary | ICD-10-CM

## 2021-12-13 MED ORDER — ONDANSETRON 4 MG PO TBDP: 4.0000 mg | ORAL_TABLET | Freq: Three times a day (TID) | ORAL | 0 refills | Status: DC | PRN

## 2021-12-13 MED ORDER — ONDANSETRON 4 MG PO TBDP
4.0000 mg | ORAL_TABLET | Freq: Once | ORAL | Status: AC
Start: 2021-12-13 — End: 2021-12-13
  Administered 2021-12-13: 4 mg via ORAL

## 2021-12-13 NOTE — ED Notes (Signed)
Pt tolerated ice water. NP aware.

## 2021-12-13 NOTE — ED Notes (Signed)
Ice water provided for PO challenge per NP.

## 2021-12-13 NOTE — ED Triage Notes (Signed)
Pt reports abdominal pain, nausea, emesis, diarrhea x2 days. Pt reports spouse left work today for similar symptoms. Denies any known fevers but reports intermittent chills.

## 2021-12-13 NOTE — Discharge Instructions (Addendum)
-   We have given you Zofran today for the nausea - I have sent this to your pharmacy, please take every 8 hours as needed for nausea/vomiting - Your symptoms should last only for a couple more days at the most, please follow up if they last longer or you develop severe nausea/vomiting despite the medicine and are unable to keep food or fluids down

## 2021-12-13 NOTE — ED Provider Notes (Signed)
[facility]    CSN: [id number] Arrival date & time: 12/13/21  1407      History   Chief Complaint Chief Complaint  Patient presents with   Abdominal Pain    HPI Roy Rowe is a 26 y.o. male.   Patient presents with 1 day of abdominal pain worse with eating or drinking, nausea, bilious vomiting, and multiple episodes of diarrhea yesterday.  He denies hematemesis, blood in stool.  Has had some chills, however denies fevers at home.  Appetite has been decreased, has only drank some water today and has not eaten anything yet.  Took pepto bismol earlier with some relief of symptoms.  Denies recent antibiotic use.  Reports he is feeling a little bit better than yesterday, however has not eaten anything.  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 Authorizing Provider  Adalimumab 40 MG/0.4ML PNKT Inject 40 mg into the skin every 14 (fourteen) days. 12/03/21  Yes [provider]  folic acid (FOLVITE) 1 MG tablet Take 1 tablet by mouth daily. 12/03/21  Yes [provider]  methotrexate (RHEUMATREX) 2.5 MG tablet Take 2.5 mg by mouth once a week. 12/03/21  Yes [provider]  albuterol (VENTOLIN HFA) 108 (90 Base) MCG/ACT inhaler Inhale 1-2 puffs into the lungs every 6 (six) hours as needed for wheezing or shortness of breath.    [provider]  azaTHIOprine (IMURAN) 50 MG tablet Take 50 mg by mouth daily. 09/06/20   [provider]  Calcium-Magnesium-Zinc [phone] MG TABS Take 1 tablet by mouth daily.    [provider]  COD LIVER OIL PO Take 1 capsule by mouth daily.    [provider]  dorzolamide-timolol (COSOPT) 22.3-6.8 MG/ML ophthalmic solution 1 drop 2 (two) times daily. Patient not taking: Reported on 12/13/2021    [provider]  milk thistle 175 MG tablet Take 175 mg by mouth daily. Patient not taking: Reported on 12/13/2021    [provider]  nystatin (MYCOSTATIN/NYSTOP) powder Apply 1 application topically 3 (three) times daily. Patient not taking: Reported on 12/13/2021 03/04/21   Geoffery Lyons, MD  ondansetron (ZOFRAN-ODT) 4 MG disintegrating tablet Take 1 tablet (4 mg total) by mouth every 8 (eight) hours as needed for nausea or vomiting. 12/13/21  Yes Valentino Nose, NP  Potassium Chloride ER 20 MEQ TBCR Take 20 mEq by mouth daily. 1 tab daily  by mouth Patient not taking: Reported on 11/15/2020 11/07/20   Shon Hale, MD  predniSONE (DELTASONE) 10 MG tablet Take 30 mg by mouth daily. 10/27/20   [provider]  sodium chloride (OCEAN) 0.65 % SOLN nasal spray Place 1 spray into both nostrils as needed for congestion. Patient not taking: Reported on 12/13/2021 11/15/20   Wurst, [place], PA-C  tobramycin (TOBREX) 0.3 % ophthalmic solution Place 1 drop into the right eye every 4 (four) hours. Patient not taking: Reported on 12/13/2021 06/15/20   Moshe Cipro, NP  verapamil  (CALAN) 120 MG tablet Take 1 tablet (120 mg total) by mouth 3 (three) times daily. 02/09/21   Bing Neighbors, FNP    Family History Family History  Problem Relation Age of Onset   Allergic rhinitis Brother    Asthma Brother     Social History Social History   Tobacco Use   Smoking status: Never    Passive exposure: Yes   Smokeless tobacco: Never  Vaping Use   Vaping Use: Never used  Substance Use Topics   Alcohol use: Not Currently    Comment: rarely   Drug use: No     Allergies   Pistachio nut (diagnostic) and Grape (artificial) flavor   Review of Systems Review of Systems Per HPI  Physical Exam Triage Vital Signs ED Triage Vitals  Enc Vitals Group     BP 12/13/21 1418 (!) 150/74     Pulse Rate 12/13/21 1418 76     Resp 12/13/21 1418 20     Temp 12/13/21 1418 98 F (36.7 C)     Temp Source 12/13/21 1418 Oral     SpO2 12/13/21 1418 96 %     Weight --      Height --      Head Circumference --      Peak Flow --      Pain Score 12/13/21 1419 2     Pain Loc --      Pain Edu? --      Excl. in GC? --    No data found.  Updated Vital Signs BP (!) 150/74 (BP Location: Right Arm)   Pulse 76   Temp 98 F (36.7 C) (Oral)   Resp 20   SpO2 96%   Visual Acuity Right Eye Distance:   Left Eye Distance:   Bilateral Distance:    Right Eye Near:   Left Eye Near:    Bilateral Near:     Physical Exam Vitals and nursing note reviewed.  Constitutional:      General: He is not in acute distress.    Appearance: Normal appearance. He is not toxic-appearing.  HENT:     Head: Normocephalic and atraumatic.     Mouth/Throat:     Mouth: Mucous membranes are moist.     Pharynx: Oropharynx is clear. No posterior oropharyngeal erythema.  Cardiovascular:     Rate and Rhythm: Normal rate and regular rhythm.  Pulmonary:     Effort: Pulmonary effort is normal. No respiratory distress.     Breath sounds: Normal breath sounds. No wheezing, rhonchi or rales.   Abdominal:     General: Abdomen is flat. Bowel sounds are increased. There is no distension.     Palpations: Abdomen is soft.     Tenderness: There is generalized abdominal tenderness. There is no right CVA tenderness, left CVA tenderness, guarding or rebound. Negative signs include Murphy's sign and McBurney's sign.  Musculoskeletal:     Cervical  back: Normal range of motion.  Lymphadenopathy:     Cervical: No cervical adenopathy.  Skin:    General: Skin is warm and dry.     Capillary Refill: Capillary refill takes less than 2 seconds.     Coloration: Skin is not jaundiced or pale.     Findings: No erythema.  Neurological:     Mental Status: He is alert and oriented to person, place, and time.     Gait: Gait normal.  Psychiatric:        Behavior: Behavior is cooperative.      UC Treatments / Results  Labs (all labs ordered are listed, but only abnormal results are displayed) Labs Reviewed - No data to display  EKG   Radiology No results found.  Procedures Procedures (including critical care time)  Medications Ordered in UC Medications  ondansetron (ZOFRAN-ODT) disintegrating tablet 4 mg (4 mg Oral Given 12/13/21 1437)    Initial Impression / Assessment and Plan / UC Course  I have reviewed the triage vital signs and the nursing notes.  Pertinent labs & imaging results that were available during my care of the patient were reviewed by me and considered in my medical decision making (see chart for details).    Patient is a pleasant, well-appearing 26 year old male presenting for abdominal pain, nausea with bilious vomiting, and diarrhea today.  On examination, patient's vital signs are stable, he is nontachycardic, slightly hypertensive, afebrile, and oxygenating well.  Suspect viral gastroenteritis.  Patient was treated with Zofran 4 mg ODT today which helped alleviate the nausea.  Additionally, he was able to tolerate a cup of water without vomiting while in clinic.   Will discharge home with a prescription for Zofran, encouraged to push hydration, start clear liquid diet today or tomorrow and soft foods thereafter.  Seek care if symptoms worsen; ER precautions discussed. Final Clinical Impressions(s) / UC Diagnoses   Final diagnoses:  Gastroenteritis     Discharge Instructions      - We have given you Zofran today for the nausea - I have sent this to your pharmacy, please take every 8 hours as needed for nausea/vomiting - Your symptoms should last only for a couple more days at the most, please follow up if they last longer or you develop severe nausea/vomiting despite the medicine and are unable to keep food or fluids down     ED Prescriptions     Medication Sig Dispense Auth. Provider   ondansetron (ZOFRAN-ODT) 4 MG disintegrating tablet Take 1 tablet (4 mg total) by mouth every 8 (eight) hours as needed for nausea or vomiting. 20 tablet Valentino Nose, NP      PDMP not reviewed this encounter.   Valentino Nose, NP 12/13/21 1459

## 2022-02-03 ENCOUNTER — Ambulatory Visit
Admission: RE | Admit: 2022-02-03 | Discharge: 2022-02-03 | Disposition: A | Discharge status: Home / Self Care | Payer: BC Managed Care – PPO | Source: Ambulatory Visit | Attending: Nurse Practitioner | Admitting: Nurse Practitioner

## 2022-02-03 VITALS — BP 147/89 | HR 85 | Temp 98.3°F | Resp 20

## 2022-02-03 DIAGNOSIS — J069 Acute upper respiratory infection, unspecified: Secondary | ICD-10-CM

## 2022-02-03 DIAGNOSIS — Z0189 Encounter for other specified special examinations: Secondary | ICD-10-CM | POA: Diagnosis not present

## 2022-02-03 DIAGNOSIS — J029 Acute pharyngitis, unspecified: Secondary | ICD-10-CM

## 2022-02-03 DIAGNOSIS — R059 Cough, unspecified: Secondary | ICD-10-CM | POA: Diagnosis not present

## 2022-02-03 DIAGNOSIS — Z79899 Other long term (current) drug therapy: Secondary | ICD-10-CM | POA: Insufficient documentation

## 2022-02-03 DIAGNOSIS — Z1152 Encounter for screening for COVID-19: Secondary | ICD-10-CM | POA: Diagnosis not present

## 2022-02-03 LAB — POCT RAPID STREP A (OFFICE): Rapid Strep A Screen: NEGATIVE

## 2022-02-03 MED ORDER — MONTELUKAST SODIUM 10 MG PO TABS: 10.0000 mg | ORAL_TABLET | Freq: Every day | ORAL | 0 refills | Status: DC

## 2022-02-03 MED ORDER — PROMETHAZINE-DM 6.25-15 MG/5ML PO SYRP: 5.0000 mL | ORAL_SOLUTION | Freq: Four times a day (QID) | ORAL | 0 refills | Status: DC | PRN

## 2022-02-03 MED ORDER — CETIRIZINE HCL 10 MG PO TABS: 10.0000 mg | ORAL_TABLET | Freq: Every day | ORAL | 0 refills | Status: DC

## 2022-02-03 MED ORDER — FLUTICASONE PROPIONATE 50 MCG/ACT NA SUSP: 2.0000 | Freq: Every day | NASAL | 0 refills | Status: DC

## 2022-02-03 NOTE — Discharge Instructions (Addendum)
The rapid strep test is negative. COVID test and throat culture are pending. You will be contacted if the results are positive to discuss treatment. If the COVID test is positive, you are candidate to receive molnupiravir.  Take medication as prescribed. Increase fluids and allow for plenty of rest. Recommend Tylenol or ibuprofen as needed for pain, fever, or general discomfort. Warm salt water gargles 3-4 times daily to help with throat pain or discomfort. Recommend using a humidifier at bedtime during sleep to help with cough and nasal congestion. Sleep elevated on 2 pillows. Go the emergency department if you develop worsening shortness of breath, difficulty breathing, inability begin a complete sentence, or other concerns. As discussed, if symptoms fail to improve, please follow-up with your primary care physician.

## 2022-02-03 NOTE — ED Provider Notes (Signed)
[facility]    CSN: [id number] Arrival date & time: 02/03/22  1217      History   Chief Complaint Chief Complaint  Patient presents with   Appointment    1300 stuffy, hard to breathe at night, want covid tested - Entered by patient   Nasal Congestion         HPI Roy Rowe is a 26 y.o. male.   The history is provided by the patient.   Patient presents for complaints of nasal congestion, generalized body aches, sore throat, and difficulty breathing at nighttime.  Symptoms started approximately 3 to 4 days ago.  Patient denies fever, chills, ear pain, ear drainage, trouble breathing, or GI symptoms.  Patient states that he does have a history of seasonal allergies, asthma, and sarcoidosis.  Patient states he has been taking Mucinex DM which does help with his symptoms.  Patient states he was around his sister who has been sick, but he does not know which she was sick with.  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 Authorizing Provider  cetirizine (ZYRTEC) 10 MG  tablet Take 1 tablet (10 mg total) by mouth daily. 02/03/22  Yes Eleesha Purkey-Warren, Alda Lea, NP  fluticasone (FLONASE) 50 MCG/ACT nasal spray Place 2 sprays into both nostrils daily. 02/03/22  Yes Shauntay Brunelli-Warren, Alda Lea, NP  montelukast (SINGULAIR) 10 MG tablet Take 1 tablet (10 mg total) by mouth at bedtime. 02/03/22  Yes Jhovani Griswold-Warren, Alda Lea, NP  promethazine-dextromethorphan (PROMETHAZINE-DM) 6.25-15 MG/5ML syrup Take 5 mLs by mouth 4 (four) times daily as needed for cough. 02/03/22  Yes Prabhjot Piscitello-Warren, Alda Lea, NP  Adalimumab 40 MG/0.4ML PNKT Inject 40 mg into the skin every 14 (fourteen) days. 12/03/21   [provider]  albuterol (VENTOLIN HFA) 108 (90 Base) MCG/ACT inhaler Inhale 1-2 puffs into the lungs every 6 (six) hours as needed for wheezing or shortness of breath.    [provider]  azaTHIOprine (IMURAN) 50 MG tablet Take 50 mg by mouth daily. 09/06/20   [provider]  Calcium-Magnesium-Zinc [phone] MG TABS Take 1 tablet by mouth daily.    [provider]  COD LIVER OIL PO Take 1 capsule by mouth daily.    [provider]  dorzolamide-timolol (COSOPT) 22.3-6.8 MG/ML ophthalmic solution 1 drop 2 (two) times daily. Patient not taking: Reported on 12/13/2021    [provider]  folic acid (FOLVITE) 1 MG tablet Take 1 tablet by mouth daily. 12/03/21   [provider]  methotrexate (RHEUMATREX) 2.5 MG tablet Take 2.5 mg by mouth once a week. 12/03/21   [provider]  milk thistle 175 MG tablet Take 175 mg by mouth daily. Patient not taking: Reported on 12/13/2021    [provider]  nystatin (MYCOSTATIN/NYSTOP) powder Apply 1 application topically 3 (three) times daily. Patient not taking: Reported on 12/13/2021 03/04/21   Geoffery Lyons, MD  ondansetron (ZOFRAN-ODT) 4 MG disintegrating tablet Take 1 tablet (4 mg total) by mouth every 8 (eight) hours as needed for nausea or vomiting. 12/13/21   Valentino Nose,  NP  Potassium Chloride ER 20 MEQ TBCR Take 20 mEq by mouth daily. 1 tab daily by mouth Patient not taking: Reported on 11/15/2020 11/07/20   Shon Hale, MD  predniSONE (DELTASONE) 10 MG tablet Take 30 mg by mouth daily. 10/27/20   [provider]  sodium chloride (OCEAN) 0.65 % SOLN nasal spray Place 1 spray into both nostrils as needed for congestion. Patient not taking: Reported on 12/13/2021 11/15/20   Wurst, [place], PA-C  tobramycin (TOBREX) 0.3 % ophthalmic solution Place 1 drop into the right eye every 4 (four) hours. Patient not taking: Reported on 12/13/2021 06/15/20   Moshe Cipro, NP  verapamil (CALAN) 120 MG tablet Take 1 tablet (120 mg total) by mouth 3 (three) times daily. 02/09/21   Bing Neighbors, FNP    Family History Family History  Problem Relation Age of Onset   Allergic rhinitis Brother    Asthma Brother     Social History Social History   Tobacco Use   Smoking status: Never    Passive exposure: Yes   Smokeless tobacco: Never  Vaping Use   Vaping Use: Never used  Substance Use Topics   Alcohol use: Not Currently    Comment: rarely   Drug use: No     Allergies   Pistachio nut (diagnostic) and Grape (artificial) flavor   Review of Systems Review of Systems Per HPI  Physical Exam Triage Vital Signs ED Triage Vitals  Enc Vitals Group     BP 02/03/22 1252 (!) 147/89     Pulse Rate 02/03/22 1252 85     Resp 02/03/22 1252 20     Temp 02/03/22 1252 98.3 F (36.8 C)     Temp Source 02/03/22 1252 Oral     SpO2 02/03/22 1252 95 %     Weight --      Height --      Head Circumference --      Peak Flow --      Pain Score 02/03/22 1250 6     Pain Loc --      Pain Edu? --      Excl. in GC? --    No data found.  Updated Vital Signs BP (!) 147/89 (BP Location: Right Wrist)   Pulse 85   Temp 98.3 F (36.8 C) (Oral)   Resp 20   SpO2 95%   Visual Acuity Right Eye Distance:   Left Eye Distance:   Bilateral Distance:    Right  Eye Near:   Left Eye Near:    Bilateral Near:     Physical Exam Vitals and nursing note reviewed.  Constitutional:      General: He is not in acute distress.    Appearance: Normal appearance. He is well-developed.  HENT:     Head: Normocephalic and atraumatic.     Right Ear: Tympanic membrane, ear canal and external ear normal.  Left Ear: Tympanic membrane, ear canal and external ear normal.     Nose: Congestion present.     Right Turbinates: Enlarged and swollen.     Left Turbinates: Enlarged and swollen.     Right Sinus: No maxillary sinus tenderness or frontal sinus tenderness.     Left Sinus: No maxillary sinus tenderness or frontal sinus tenderness.     Mouth/Throat:     Lips: Pink.     Mouth: Mucous membranes are moist.     Pharynx: Uvula midline. Posterior oropharyngeal erythema present. No pharyngeal swelling, oropharyngeal exudate or uvula swelling.  Eyes:     Extraocular Movements: Extraocular movements intact.     Conjunctiva/sclera: Conjunctivae normal.     Pupils: Pupils are equal, round, and reactive to light.  Neck:     Thyroid: No thyromegaly.     Trachea: No tracheal deviation.  Cardiovascular:     Rate and Rhythm: Normal rate and regular rhythm.     Heart sounds: Normal heart sounds.  Pulmonary:     Effort: Pulmonary effort is normal. No respiratory distress.     Breath sounds: Normal breath sounds. No stridor. No wheezing, rhonchi or rales.  Abdominal:     General: Bowel sounds are normal. There is no distension.     Palpations: Abdomen is soft.     Tenderness: There is no abdominal tenderness.  Musculoskeletal:     Cervical back: Normal range of motion and neck supple.  Skin:    General: Skin is warm and dry.  Neurological:     General: No focal deficit present.     Mental Status: He is alert and oriented to person, place, and time.  Psychiatric:        Mood and Affect: Mood normal.        Behavior: Behavior normal.        Thought Content:  Thought content normal.        Judgment: Judgment normal.      UC Treatments / Results  Labs (all labs ordered are listed, but only abnormal results are displayed) Labs Reviewed  CULTURE, GROUP A STREP ([facility])  SARS CORONAVIRUS 2 (TAT 6-24 HRS)  POCT RAPID STREP A (OFFICE)    EKG   Radiology No results found.  Procedures Procedures (including critical care time)  Medications Ordered in UC Medications - No data to display  Initial Impression / Assessment and Plan / UC Course  I have reviewed the triage vital signs and the nursing notes.  Pertinent labs & imaging results that were available during my care of the patient were reviewed by me and considered in my medical decision making (see chart for details).  Patient presents for upper respiratory symptoms that been present over the past 2 to 3 days.  On exam, patient's vital signs are stable, he is in no acute distress, lung sounds are clear throughout.  There is no obvious wheezing, rales, or rhonchi heard on his exam.  Differential diagnoses include viral upper respiratory infection with cough, COVID, influenza, or asthma exacerbation.  Rapid strep test was negative, throat culture, and COVID test are pending at this time.  Patient is a candidate to receive molnupiravir if his COVID test is positive.  In the interim, patient was prescribed Promethazine DM, fluticasone, Zyrtec, and Singulair.  Supportive care recommendations were provided to the patient, along with strict indications of when to go to the emergency department.  Patient was advised that he will be contacted if his pending test  results are positive.  Patient verbalizes understanding.  All questions were answered. Final Clinical Impressions(s) / UC Diagnoses   Final diagnoses:  Sore throat  Viral upper respiratory tract infection with cough  Patient requested diagnostic testing     Discharge Instructions      The rapid strep test is negative. COVID test and  throat culture are pending. You will be contacted if the results are positive to discuss treatment. If the COVID test is positive, you are candidate to receive molnupiravir.  Take medication as prescribed. Increase fluids and allow for plenty of rest. Recommend Tylenol or ibuprofen as needed for pain, fever, or general discomfort. Warm salt water gargles 3-4 times daily to help with throat pain or discomfort. Recommend using a humidifier at bedtime during sleep to help with cough and nasal congestion. Sleep elevated on 2 pillows. Go the emergency department if you develop worsening shortness of breath, difficulty breathing, inability begin a complete sentence, or other concerns. As discussed, if symptoms fail to improve, please follow-up with your primary care physician.     ED Prescriptions     Medication Sig Dispense Auth. Provider   promethazine-dextromethorphan (PROMETHAZINE-DM) 6.25-15 MG/5ML syrup Take 5 mLs by mouth 4 (four) times daily as needed for cough. 140 mL Jashad Depaula-Warren, Sadie Haber, NP   fluticasone (FLONASE) 50 MCG/ACT nasal spray Place 2 sprays into both nostrils daily. 16 g Amarie Tarte-Warren, Sadie Haber, NP   cetirizine (ZYRTEC) 10 MG tablet Take 1 tablet (10 mg total) by mouth daily. 30 tablet Tashanna Dolin-Warren, Sadie Haber, NP   montelukast (SINGULAIR) 10 MG tablet Take 1 tablet (10 mg total) by mouth at bedtime. 30 tablet Tramon Crescenzo-Warren, Sadie Haber, NP      PDMP not reviewed this encounter.   Abran Cantor, NP 02/03/22 1333

## 2022-02-03 NOTE — ED Triage Notes (Signed)
Pt reports sore throat,body aches and nasal congestion x 3 days. Mucinex DM gives some relief.    Pt requested COVID test.

## 2022-02-04 LAB — SARS CORONAVIRUS 2 (TAT 6-24 HRS): SARS Coronavirus 2: NEGATIVE

## 2022-02-05 NOTE — Progress Notes (Signed)
A user error has taken place: encounter opened in error, closed for administrative reasons.

## 2022-02-06 LAB — CULTURE, GROUP A STREP (THRC)

## 2022-05-21 IMAGING — CR DG CHEST 2V
2 series · 2 of 2 positions shown · non-contrast
Comparison: 11/15/2020

CLINICAL DATA: Dyspnea

EXAM:
CHEST - 2 VIEW

[chest pa]
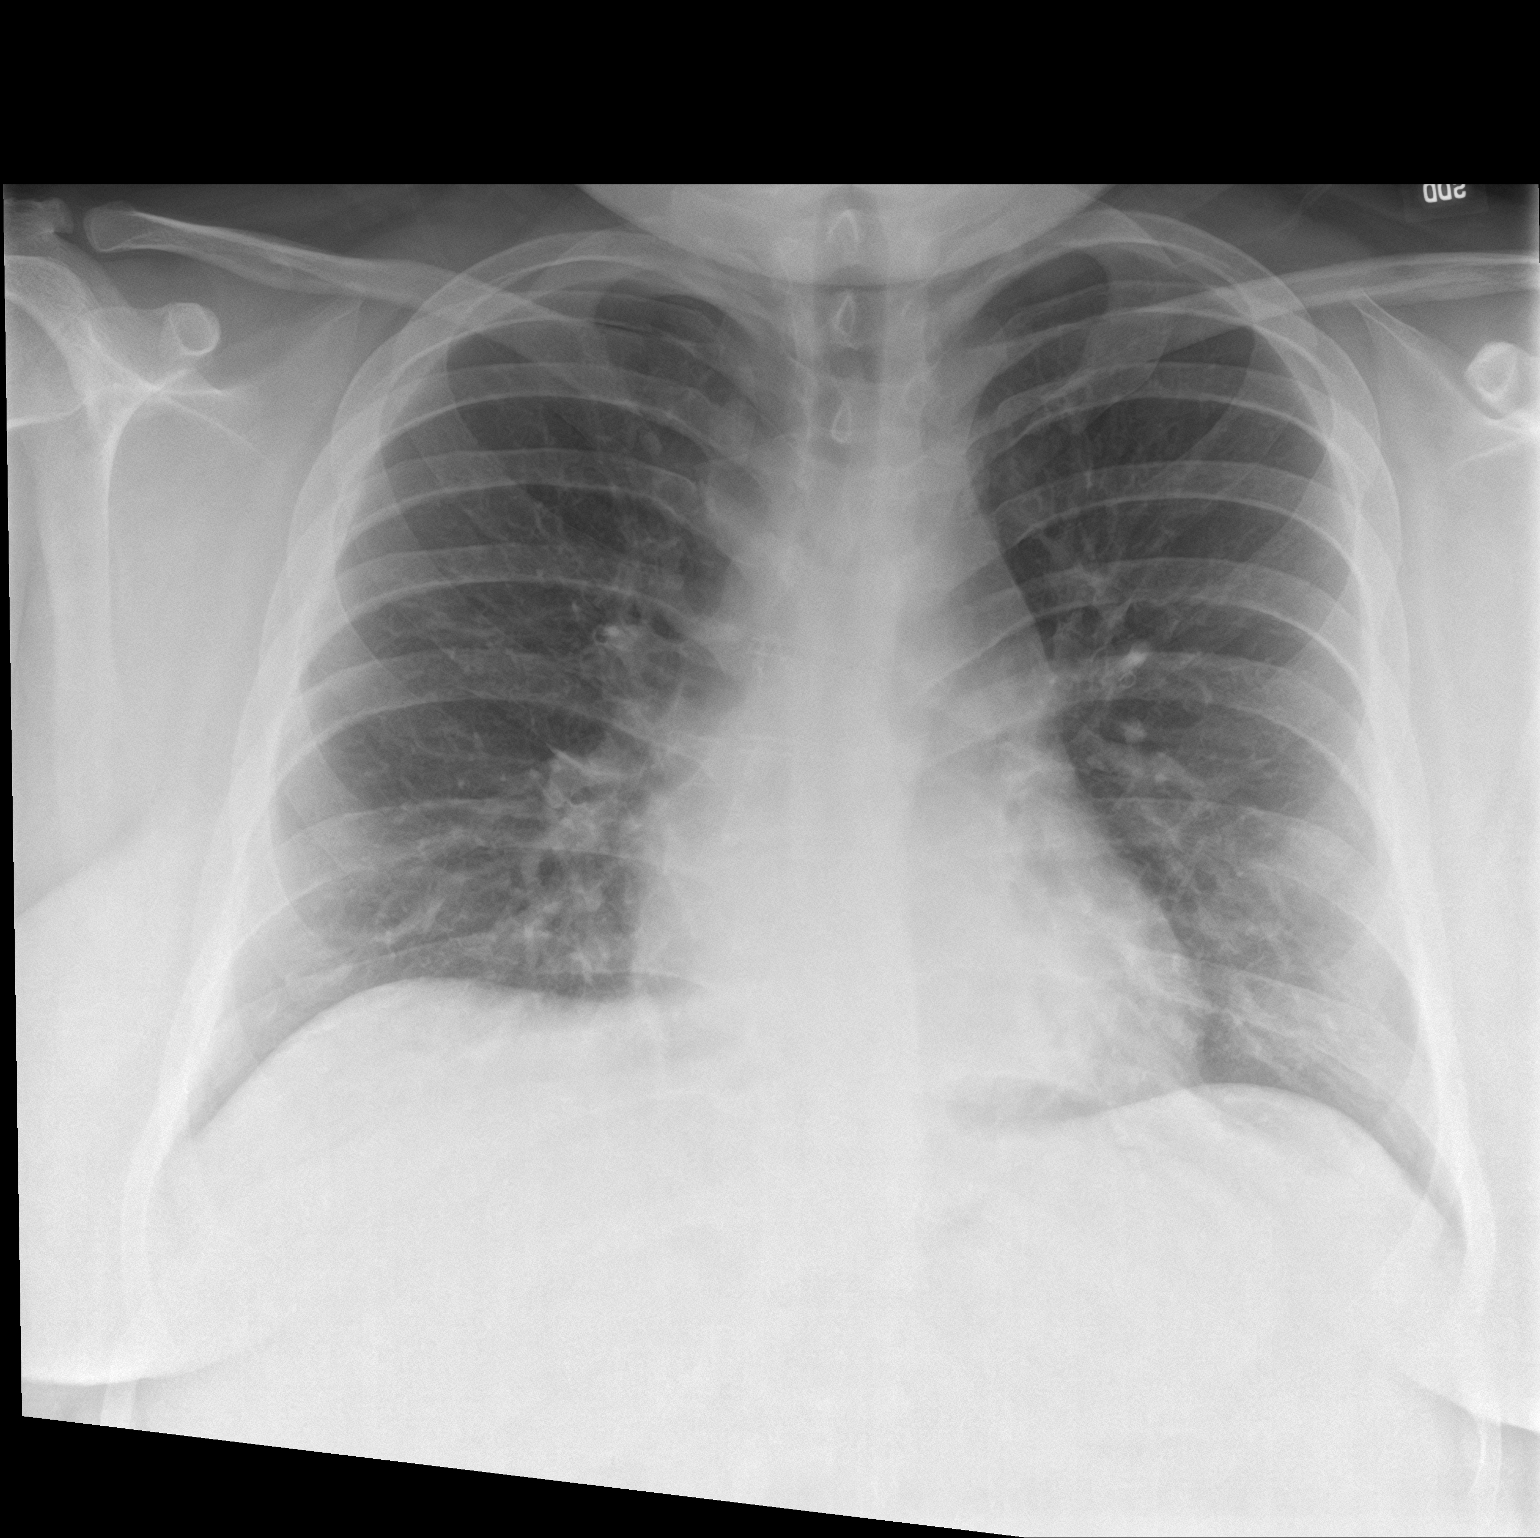

[chest lat]
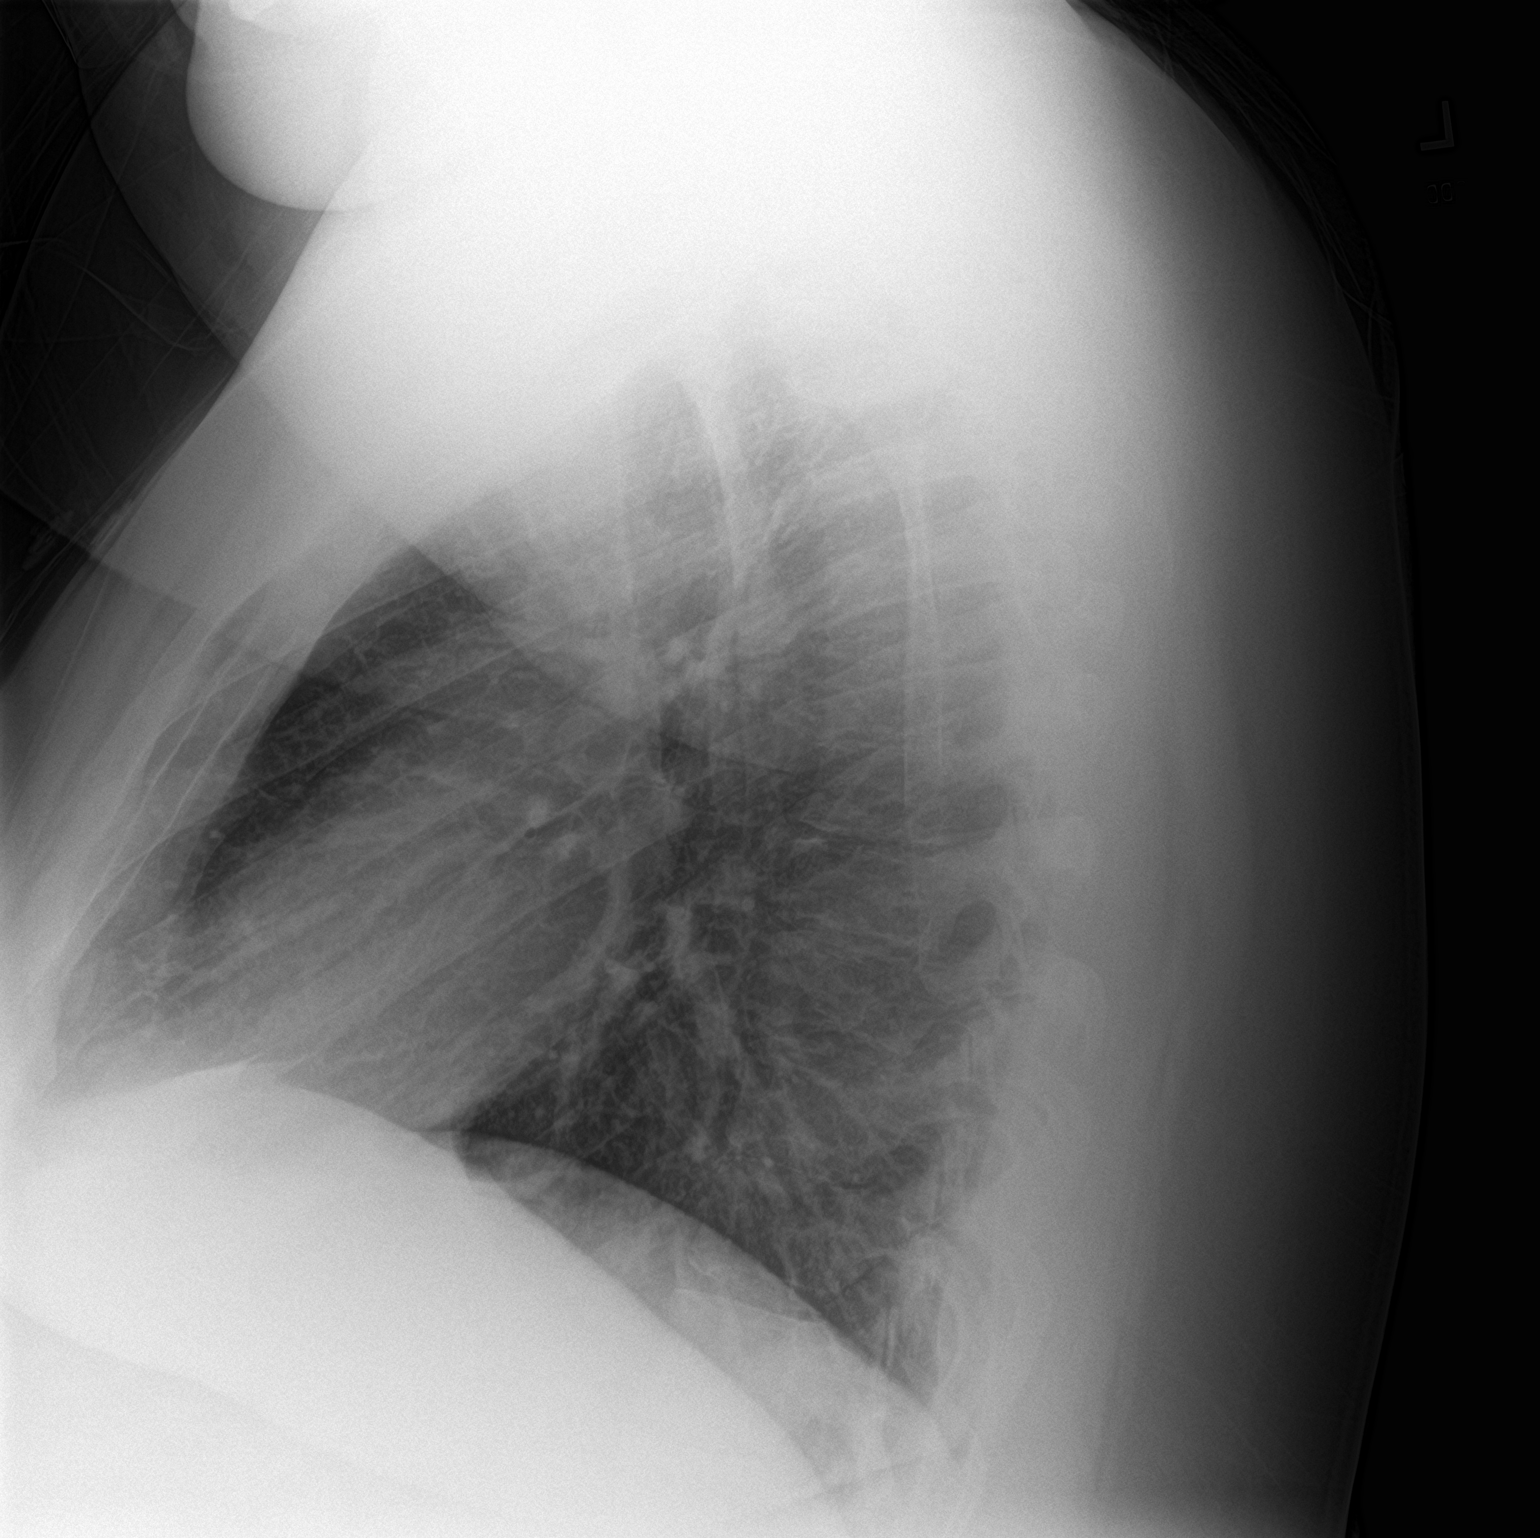

[2 of 2 positions shown; findings below may reference images not displayed]

FINDINGS: The heart size and mediastinal contours are within normal limits.
Both lungs are clear. The visualized skeletal structures are
unremarkable.
IMPRESSION: No active cardiopulmonary disease.

## 2022-05-22 IMAGING — CT CT ABD-PELV W/O CM
2 of 4 series · 17 of 46 positions shown, 19 images · non-contrast
Comparison: None.

CLINICAL DATA: Acute, nonlocalized abdominal pain. Evaluate for
hernia or abscess.

EXAM:
CT ABDOMEN AND PELVIS WITHOUT CONTRAST
TECHNIQUE: Multidetector CT imaging of the abdomen and pelvis was performed
following the standard protocol without IV contrast.

[Series 2: routine abd/pel wo · axial · 0.98mm/px · z∈[-1174,-664]mm · 14 of 112 slices shown, 16 images]
[im 5/112  soft-tissue]
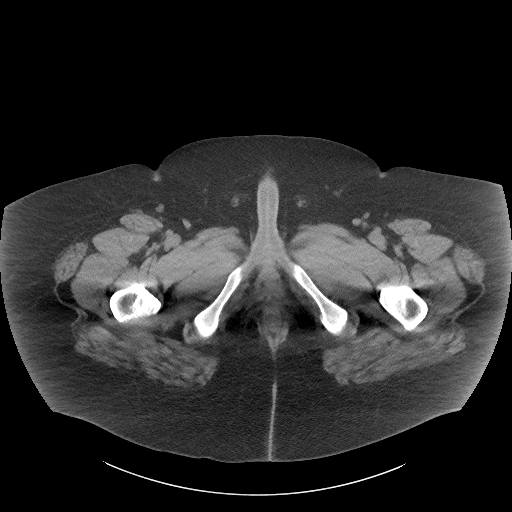
[im 5/112  bone]
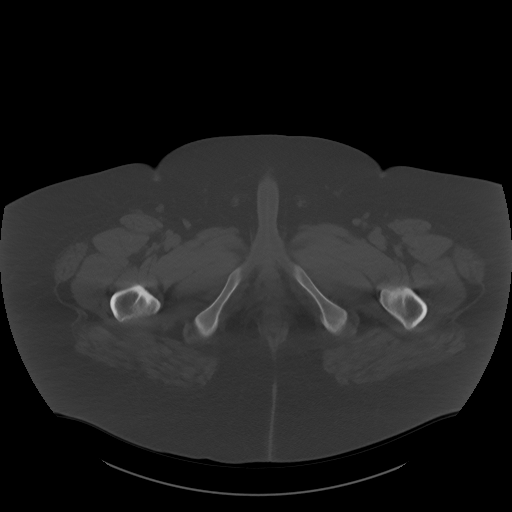
[im 14/112  soft-tissue]
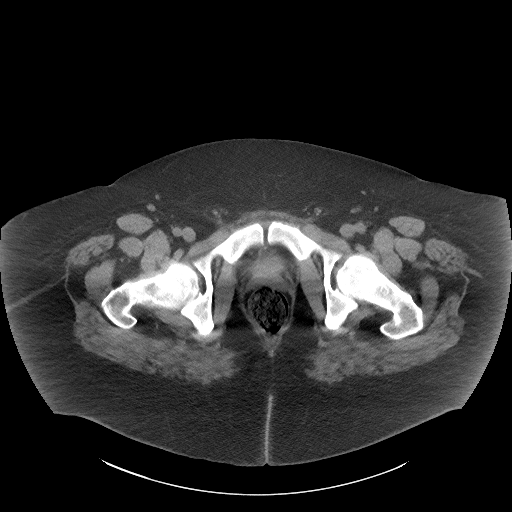
[im 24/112  soft-tissue]
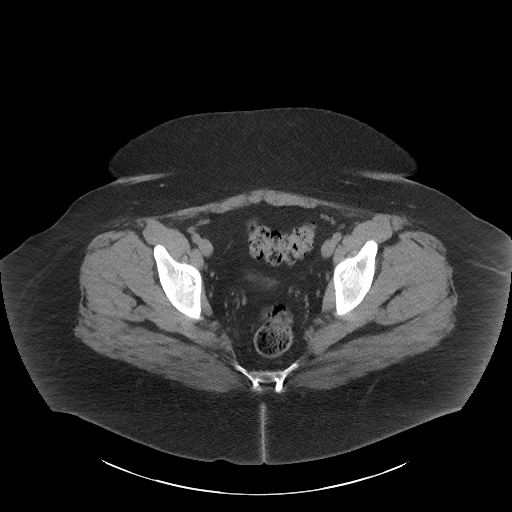
[im 28/112  soft-tissue]
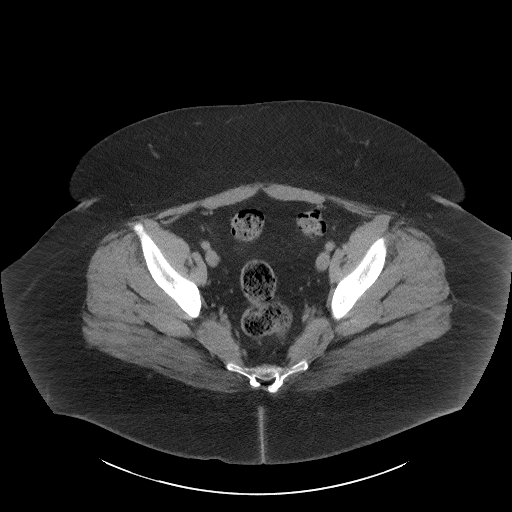
[im 38/112  soft-tissue]
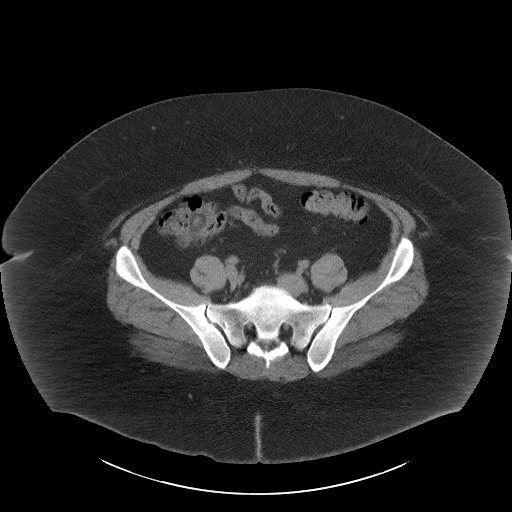
[im 47/112  soft-tissue]
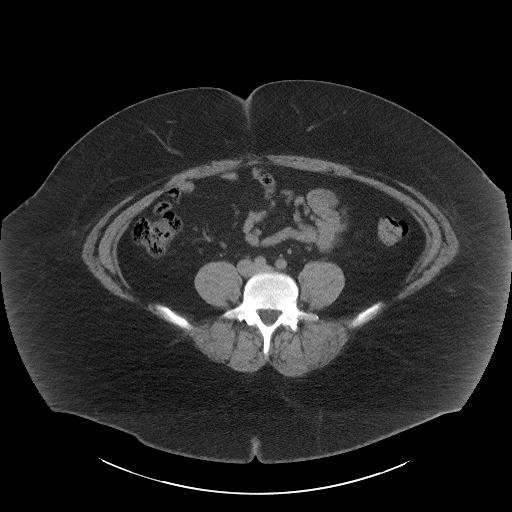
[im 51/112  soft-tissue]
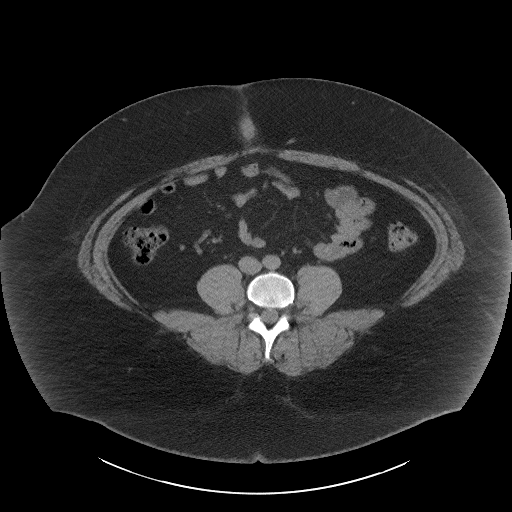
[im 61/112  soft-tissue]
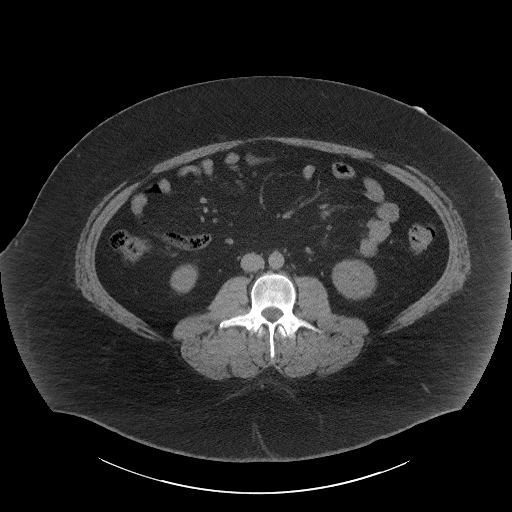
[im 65/112  soft-tissue]
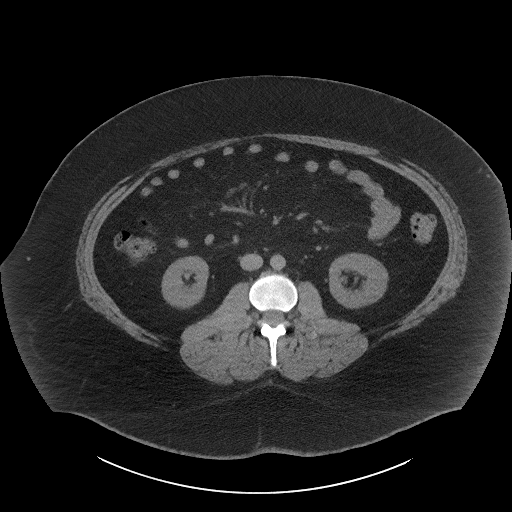
[im 65/112  bone]
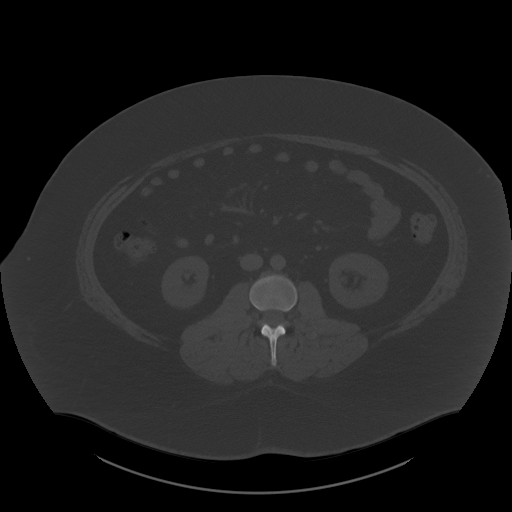
[im 75/112  soft-tissue]
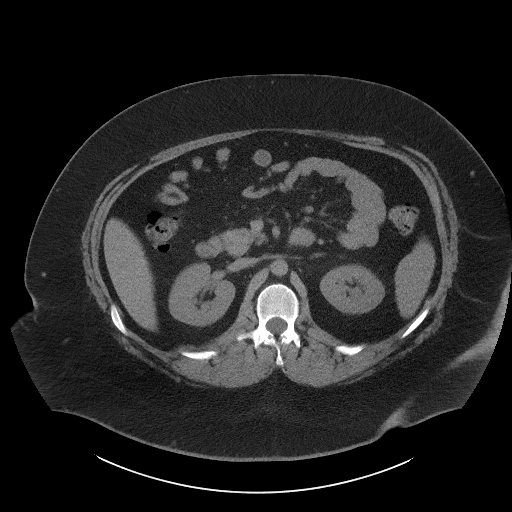
[im 84/112  soft-tissue]
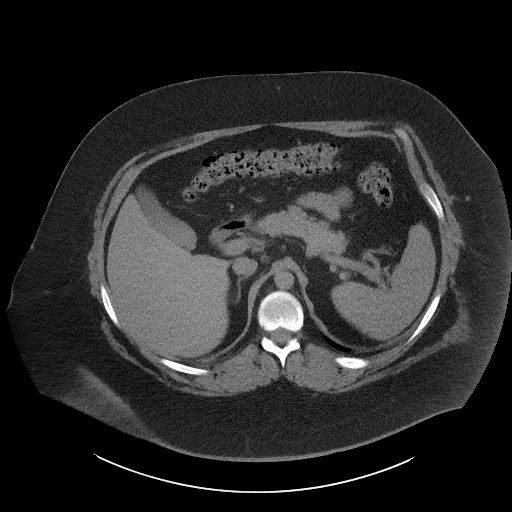
[im 88/112  soft-tissue]
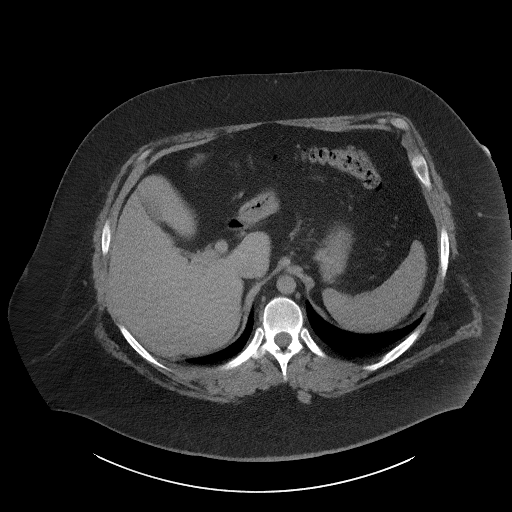
[im 98/112  soft-tissue]
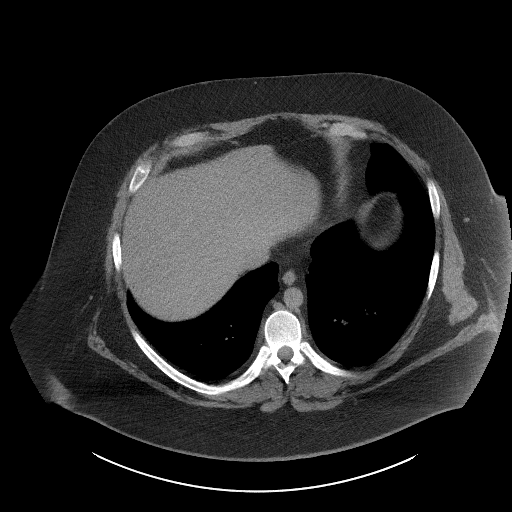
[im 107/112  soft-tissue]
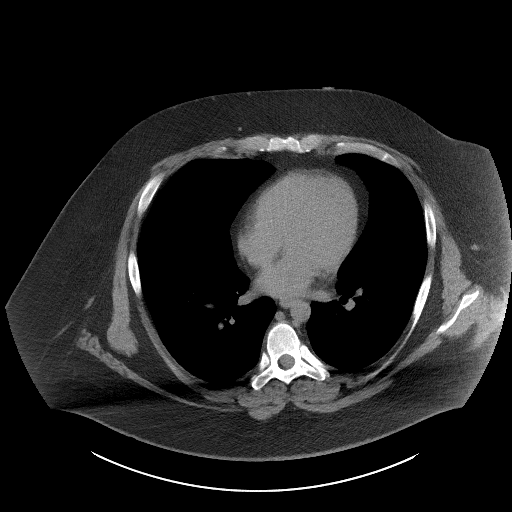

[Series 5: coronal st · coronal · 0.99mm/px · 3 of 131 slices shown]
[im 44/131  soft-tissue]
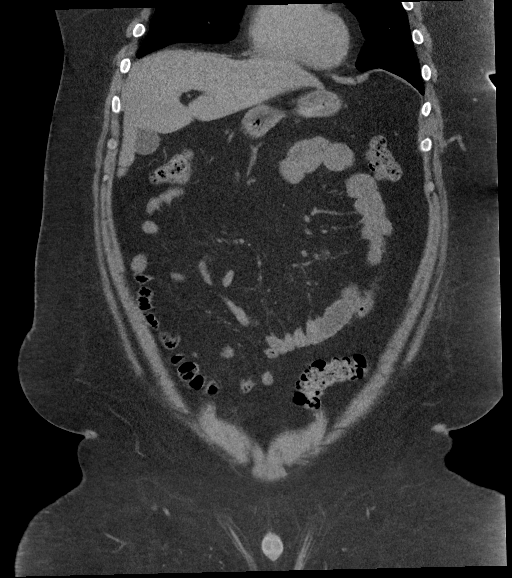
[im 58/131  soft-tissue]
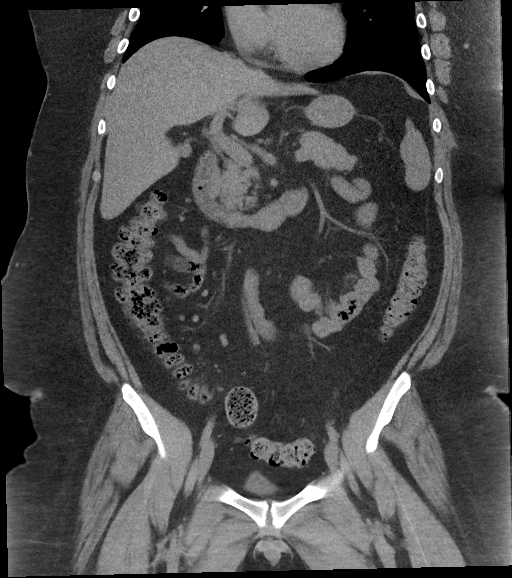
[im 73/131  soft-tissue]
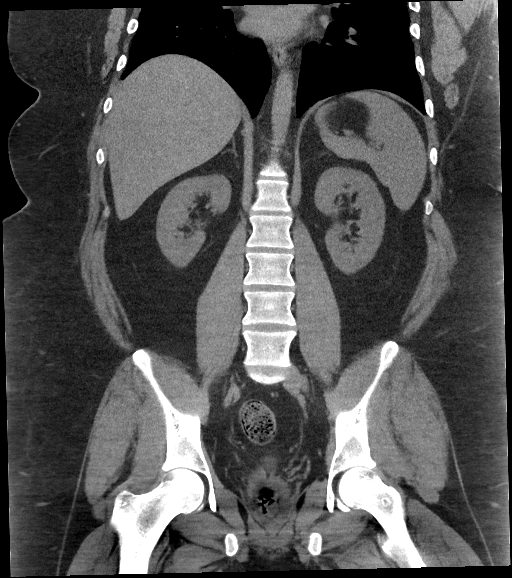

[17 of 46 positions shown; findings below may reference images not displayed]

FINDINGS: Lower chest:  No contributory findings.

Hepatobiliary: No focal liver abnormality.No evidence of biliary
obstruction or stone.

Pancreas: Unremarkable.

Spleen: Unremarkable.

Adrenals/Urinary Tract: Negative adrenals. No hydronephrosis or
stone. Unremarkable bladder.

Stomach/Bowel:  No obstruction. No appendicitis.

Vascular/Lymphatic: No acute vascular abnormality. No mass or
adenopathy.

Reproductive:No pathologic findings.

Other: No ascites or pneumoperitoneum. Calcification at the
umbilicus which may be related to a chronic fatty hernia or
pilonidal cyst.

Musculoskeletal: No acute abnormalities.
IMPRESSION: Calcification at the umbilicus which may be related to a chronic
fatty hernia or pilonidal cyst.

Otherwise negative abdominal CT.

## 2022-11-17 ENCOUNTER — Other Ambulatory Visit: Payer: Self-pay

## 2022-11-17 ENCOUNTER — Emergency Department (HOSPITAL_COMMUNITY): Payer: Self-pay

## 2022-11-17 ENCOUNTER — Emergency Department (HOSPITAL_COMMUNITY)
Admission: EM | Admit: 2022-11-17 | Discharge: 2022-11-18 | Discharge status: Against Medical Advice | Payer: Self-pay | Attending: Emergency Medicine | Admitting: Emergency Medicine

## 2022-11-17 ENCOUNTER — Encounter (HOSPITAL_COMMUNITY): Payer: Self-pay | Admitting: Emergency Medicine

## 2022-11-17 DIAGNOSIS — Z5321 Procedure and treatment not carried out due to patient leaving prior to being seen by health care provider: Secondary | ICD-10-CM | POA: Insufficient documentation

## 2022-11-17 DIAGNOSIS — R0602 Shortness of breath: Secondary | ICD-10-CM | POA: Insufficient documentation

## 2022-11-17 MED ORDER — ALBUTEROL SULFATE HFA 108 (90 BASE) MCG/ACT IN AERS: 2.0000 | INHALATION_SPRAY | RESPIRATORY_TRACT | Status: DC | PRN

## 2022-11-17 NOTE — ED Triage Notes (Signed)
Pt to ed pov c/o intermittent sob since the June 29th. Denies any cough, fevers, chills. Denies CP at this time. Pt states inhaler helps and has been needing it more lately to alleviate symptoms. NAD at this time.

## 2022-11-18 ENCOUNTER — Encounter (HOSPITAL_COMMUNITY): Payer: Self-pay | Admitting: *Deleted

## 2022-11-18 ENCOUNTER — Ambulatory Visit
Admission: EM | Admit: 2022-11-18 | Discharge: 2022-11-18 | Disposition: A | Discharge status: Home / Self Care | Payer: Self-pay | Attending: Family Medicine | Admitting: Family Medicine

## 2022-11-18 ENCOUNTER — Other Ambulatory Visit: Payer: Self-pay

## 2022-11-18 ENCOUNTER — Emergency Department (HOSPITAL_COMMUNITY)
Admission: EM | Admit: 2022-11-18 | Discharge: 2022-11-18 | Disposition: A | Discharge status: Home / Self Care | Payer: Self-pay | Attending: Student | Admitting: Student

## 2022-11-18 DIAGNOSIS — J4522 Mild intermittent asthma with status asthmaticus: Secondary | ICD-10-CM | POA: Insufficient documentation

## 2022-11-18 DIAGNOSIS — J4521 Mild intermittent asthma with (acute) exacerbation: Secondary | ICD-10-CM

## 2022-11-18 DIAGNOSIS — J4541 Moderate persistent asthma with (acute) exacerbation: Secondary | ICD-10-CM

## 2022-11-18 DIAGNOSIS — J9811 Atelectasis: Secondary | ICD-10-CM

## 2022-11-18 DIAGNOSIS — Z8616 Personal history of COVID-19: Secondary | ICD-10-CM | POA: Insufficient documentation

## 2022-11-18 LAB — CBC WITH DIFFERENTIAL/PLATELET
Abs Immature Granulocytes: 0.02 10*3/uL (ref 0.00–0.07)
Basophils Absolute: 0 10*3/uL (ref 0.0–0.1)
Basophils Relative: 0 %
Eosinophils Absolute: 0.2 10*3/uL (ref 0.0–0.5)
Eosinophils Relative: 3 %
HCT: 45.1 % (ref 39.0–52.0)
Hemoglobin: 16 g/dL (ref 13.0–17.0)
Immature Granulocytes: 0 %
Lymphocytes Relative: 32 %
Lymphs Abs: 1.8 10*3/uL (ref 0.7–4.0)
MCH: 31 pg (ref 26.0–34.0)
MCHC: 35.5 g/dL (ref 30.0–36.0)
MCV: 87.4 fL (ref 80.0–100.0)
Monocytes Absolute: 0.5 10*3/uL (ref 0.1–1.0)
Monocytes Relative: 10 %
Neutro Abs: 3 10*3/uL (ref 1.7–7.7)
Neutrophils Relative %: 55 %
Platelets: 198 10*3/uL (ref 150–400)
RBC: 5.16 MIL/uL (ref 4.22–5.81)
RDW: 12.2 % (ref 11.5–15.5)
WBC: 5.6 10*3/uL (ref 4.0–10.5)
nRBC: 0 % (ref 0.0–0.2)

## 2022-11-18 LAB — COMPREHENSIVE METABOLIC PANEL
ALT: 48 U/L — ABNORMAL HIGH (ref 0–44)
AST: 26 U/L (ref 15–41)
Albumin: 4.2 g/dL (ref 3.5–5.0)
Alkaline Phosphatase: 69 U/L (ref 38–126)
Anion gap: 8 (ref 5–15)
BUN: 19 mg/dL (ref 6–20)
CO2: 23 mmol/L (ref 22–32)
Calcium: 8.9 mg/dL (ref 8.9–10.3)
Chloride: 107 mmol/L (ref 98–111)
Creatinine, Ser: 0.76 mg/dL (ref 0.61–1.24)
GFR, Estimated: >60 mL/min (ref >60)
Glucose, Bld: 107 mg/dL — ABNORMAL HIGH (ref 70–99)
Potassium: 3.8 mmol/L (ref 3.5–5.1)
Sodium: 138 mmol/L (ref 135–145)
Total Bilirubin: 0.8 mg/dL (ref 0.3–1.2)
Total Protein: 7.5 g/dL (ref 6.5–8.1)

## 2022-11-18 LAB — BRAIN NATRIURETIC PEPTIDE: B Natriuretic Peptide: 30 pg/mL (ref 0.0–100.0)

## 2022-11-18 LAB — TROPONIN I (HIGH SENSITIVITY): Troponin I (High Sensitivity): 3 ng/L (ref <18)

## 2022-11-18 MED ORDER — BUDESONIDE-FORMOTEROL FUMARATE 160-4.5 MCG/ACT IN AERO: 2.0000 | INHALATION_SPRAY | Freq: Two times a day (BID) | RESPIRATORY_TRACT | 2 refills | Status: AC

## 2022-11-18 MED ORDER — IPRATROPIUM-ALBUTEROL 0.5-2.5 (3) MG/3ML IN SOLN
9.0000 mL | Freq: Once | RESPIRATORY_TRACT | Status: AC
  Administered 2022-11-18: 9 mL via RESPIRATORY_TRACT
  Filled 2022-11-18: qty 9

## 2022-11-18 NOTE — ED Notes (Signed)
RT at bedside administering breathing treatment.

## 2022-11-18 NOTE — ED Triage Notes (Signed)
Pt reports after his ED visit yesterday he has concerns and wants to know if it is medication for the possible collapsed lung. States he feels weak. Inhaler helps has to use it 3-4 times da day.

## 2022-11-18 NOTE — Discharge Instructions (Addendum)
Your x-ray from the emergency department triage yesterday shows some mild atelectasis at the bases.  This is likely from not taking deep enough breaths to inflate and deflate the lungs adequately.  I highly recommend getting an incentive spirometer and using this 2-3 times per day in addition to taking walks and practicing deep breaths throughout the day to help repressurize the air sacs that are collapsed.  This should resolve the issue.  We will also start a steroid inhaler for suspected asthma exacerbation causing your increased shortness of breath at this moment.  Your oxygen looks great today and your x-ray yesterday shows no evidence of pneumonia or other infectious cause of your symptoms.  As always, if you significantly worsen at any time go to the emergency department for further evaluation.  Follow-up with your primary care provider within the next week for a recheck of your symptoms.

## 2022-11-18 NOTE — ED Provider Notes (Signed)
EMERGENCY DEPARTMENT AT [facility] Provider Note  CSN: [id number] Arrival date & time: 11/18/22 0909  Chief Complaint(s) Shortness of Breath  HPI Roy Rowe is a 27 y.o. 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  Patient states that for the last 1 week he has had progressive worsening shortness of breath and generalized fatigue.  States he is using his rescue inhaler more frequently.  Came to the emergency department yesterday and received a chest x-ray but left without being seen.  He saw the results showing dependent atelectasis and his mother encouraged him to come back to the emergency department for further evaluation.  Patient went to urgent care who prescribed a steroid inhaler and ultimately discharged him home but mother encouraged him to seek full medical evaluation in the ER today.  Here in the ER, he endorses some mild shortness of breath and wheezing but denies chest pain, abdominal pain, nausea, vomiting or other systemic symptoms.   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 Authorizing Provider  Adalimumab 40 MG/0.4ML PNKT Inject 40 mg into the skin every 14 (fourteen) days. 12/03/21   [provider]  albuterol (VENTOLIN HFA) 108 (90 Base) MCG/ACT inhaler Inhale 1-2 puffs into the lungs every 6 (six) hours as needed for wheezing or shortness of breath.    [provider]  azaTHIOprine (IMURAN) 50 MG tablet Take 50 mg by mouth daily. 09/06/20   [provider]  budesonide-formoterol (SYMBICORT) 160-4.5 MCG/ACT inhaler Inhale 2 puffs into the lungs 2 (two) times daily. Rinse mouth with water after each use 11/18/22   Particia Nearing, PA-C  Calcium-Magnesium-Zinc [phone] MG TABS Take 1 tablet by mouth daily.    [provider]  cetirizine (ZYRTEC) 10 MG tablet Take 1 tablet (10 mg total) by mouth daily. 02/03/22   Leath-Warren, Sadie Haber, NP  COD LIVER OIL PO Take 1 capsule by mouth daily.    [provider]  dorzolamide-timolol (COSOPT) 22.3-6.8 MG/ML ophthalmic solution 1 drop 2 (two) times daily. Patient not taking: Reported on 12/13/2021    [provider]  fluticasone (FLONASE) 50 MCG/ACT nasal spray Place 2 sprays into both nostrils daily. 02/03/22   Leath-Warren, Sadie Haber, NP  folic acid (FOLVITE) 1 MG tablet Take 1 tablet by mouth daily. 12/03/21   [provider]  methotrexate (RHEUMATREX) 2.5 MG tablet Take 2.5 mg by mouth once a week. 12/03/21   [provider]  milk thistle 175 MG tablet Take 175 mg by mouth daily. Patient not taking: Reported on  12/13/2021    [provider]  montelukast (SINGULAIR) 10 MG tablet Take 1 tablet (10 mg total) by mouth at bedtime. 02/03/22   Leath-Warren, Sadie Haber, NP  nystatin (MYCOSTATIN/NYSTOP) powder Apply 1 application topically 3 (three) times daily. Patient not taking: Reported on 12/13/2021 03/04/21   Geoffery Lyons, MD  ondansetron (ZOFRAN-ODT) 4 MG disintegrating tablet Take 1 tablet  (4 mg total) by mouth every 8 (eight) hours as needed for nausea or vomiting. 12/13/21   Valentino Nose, NP  Potassium Chloride ER 20 MEQ TBCR Take 20 mEq by mouth daily. 1 tab daily by mouth Patient not taking: Reported on 11/15/2020 11/07/20   Shon Hale, MD  predniSONE (DELTASONE) 10 MG tablet Take 30 mg by mouth daily. 10/27/20   [provider]  promethazine-dextromethorphan (PROMETHAZINE-DM) 6.25-15 MG/5ML syrup Take 5 mLs by mouth 4 (four) times daily as needed for cough. 02/03/22   Leath-Warren, Sadie Haber, NP  sodium chloride (OCEAN) 0.65 % SOLN nasal spray Place 1 spray into both nostrils as needed for congestion. Patient not taking: Reported on 12/13/2021 11/15/20   Wurst, [place], PA-C  tobramycin (TOBREX) 0.3 % ophthalmic solution Place 1 drop into the right eye every 4 (four) hours. Patient not taking: Reported on 12/13/2021 06/15/20   Moshe Cipro, NP  verapamil (CALAN) 120 MG tablet Take 1 tablet (120 mg total) by mouth 3 (three) times daily. 02/09/21   Bing Neighbors, NP                                                                                                                                    Past Surgical History Past Surgical History:  Procedure Laterality Date   ADENOIDECTOMY     TONSILLECTOMY     Family History Family History  Problem Relation Age of Onset   Allergic rhinitis Brother    Asthma Brother     Social History Social History   Tobacco Use   Smoking status: Never    Passive exposure: Yes   Smokeless tobacco: Never  Vaping Use   Vaping Use: Never used  Substance Use Topics   Alcohol use: Not Currently    Comment: rarely   Drug use: No   Allergies Pistachio nut (diagnostic) and Grape (artificial) flavor  Review of Systems Review of Systems  Constitutional:  Positive for fatigue.  Respiratory:  Positive for chest tightness, shortness of breath and wheezing.     Physical Exam Vital Signs  I have reviewed the triage  vital signs BP 126/78   Pulse 77   Temp 97.8 F (36.6 C) (Oral)   Resp 14   Ht 6\' 2"  (1.88 m)   Wt (!) 201.9 kg   SpO2 98%   BMI 57.13 kg/m   Physical Exam Constitutional:      General: He is not in acute distress.    Appearance: Normal appearance.  HENT:     Head: Normocephalic  and atraumatic.     Nose: No congestion or rhinorrhea.  Eyes:     General:        Right eye: No discharge.        Left eye: No discharge.     Extraocular Movements: Extraocular movements intact.     Pupils: Pupils are equal, round, and reactive to light.  Cardiovascular:     Rate and Rhythm: Normal rate and regular rhythm.     Heart sounds: No murmur heard. Pulmonary:     Effort: No respiratory distress.     Breath sounds: Wheezing present. No rales.  Abdominal:     General: There is no distension.     Tenderness: There is no abdominal tenderness.  Musculoskeletal:        General: Normal range of motion.     Cervical back: Normal range of motion.  Skin:    General: Skin is warm and dry.  Neurological:     General: No focal deficit present.     Mental Status: He is alert.     ED Results and Treatments Labs (all labs ordered are listed, but only abnormal results are displayed) Labs Reviewed  COMPREHENSIVE METABOLIC PANEL - Abnormal; Notable for the following components:      Result Value   Glucose, Bld 107 (*)    ALT 48 (*)    All other components within normal limits  CBC WITH DIFFERENTIAL/PLATELET  BRAIN NATRIURETIC PEPTIDE  TROPONIN I (HIGH SENSITIVITY)                                                                                                                          Radiology DG Chest 2 View  Result Date: 11/17/2022 CLINICAL DATA:  Intermittent shortness of breath. EXAM: CHEST - 2 VIEW COMPARISON:  March 09, 2021 FINDINGS: The heart size and mediastinal contours are within normal limits. Mild atelectasis is seen within the bilateral lung bases. There is no evidence of  focal consolidation, pleural effusion or pneumothorax. The visualized skeletal structures are unremarkable. IMPRESSION: Mild bibasilar atelectasis. Electronically Signed   By: Aram Candela M.D.   On: 11/17/2022 22:20    Pertinent labs & imaging results that were available during my care of the patient were reviewed by me and considered in my medical decision making (see MDM for details).  Medications Ordered in ED Medications  ipratropium-albuterol (DUONEB) 0.5-2.5 (3) MG/3ML nebulizer solution 9 mL (9 mLs Nebulization Given 11/18/22 1023)  Procedures Procedures  (including critical care time)  Medical Decision Making / ED Course   This patient presents to the ED for concern of shortness of breath, this involves an extensive number of treatment options, and is a complaint that carries with it a high risk of complications and morbidity.  The differential diagnosis includes Pe, PTX, Pulmonary Edema, ARDS, COPD/Asthma, ACS, CHF exacerbation, Arrhythmia, Pericardial Effusion/Tamponade, Anemia, Sepsis, Acidosis/Hypercapnia, Anxiety, Viral URI  MDM: Patient seen emergency room for evaluation of shortness of breath.  Physical exam with some mild wheezing bilaterally but is otherwise unremarkable.  Laboratory evaluation unremarkable.  Patient given 3 DuoNebs and on reevaluation symptoms significant proved.  Patient given an incentive spirometer for his dependent atelectasis seen on chest x-ray yesterday.  The patient is currently being treated for sarcoid with methotrexate and Humira but the patient states that this is primarily being driven by his ophthalmologist and I do feel that assistance from pulmonologist may be helpful here.  Thus I placed an outpatient referral to pulmonology.  At this time he does not meet inpatient criteria for admission and is safe for  discharge with outpatient follow-up.  We will defer any additional steroid therapy at this time given current autoimmune regimen.  Patient has already been prescribed a Symbicort inhaler from urgent care today and he will pick this up after leaving the ER today.   Additional history obtained: -Additional history obtained from mother -External records from outside source obtained and reviewed including: Chart review including previous notes, labs, imaging, consultation notes   Lab Tests: -I ordered, reviewed, and interpreted labs.   The pertinent results include:   Labs Reviewed  COMPREHENSIVE METABOLIC PANEL - Abnormal; Notable for the following components:      Result Value   Glucose, Bld 107 (*)    ALT 48 (*)    All other components within normal limits  CBC WITH DIFFERENTIAL/PLATELET  BRAIN NATRIURETIC PEPTIDE  TROPONIN I (HIGH SENSITIVITY)       Medicines ordered and prescription drug management: Meds ordered this encounter  Medications   ipratropium-albuterol (DUONEB) 0.5-2.5 (3) MG/3ML nebulizer solution 9 mL    -I have reviewed the patients home medicines and have made adjustments as needed  Critical interventions none   Cardiac Monitoring: The patient was maintained on a cardiac monitor.  I personally viewed and interpreted the cardiac monitored which showed an underlying rhythm of: NSR  Social Determinants of Health:  Factors impacting patients care include: none   Reevaluation: After the interventions noted above, I reevaluated the patient and found that they have :improved  Co morbidities that complicate the patient evaluation  Past Medical History:  Diagnosis Date   Allergic rhinitis    Asthma    Obesity (BMI 30-39.9)    Panuveitis    Sarcoidosis       Dispostion: I considered admission for this patient, but at this time he does not meet inpatient criteria for admission he is safe for discharge with outpatient follow-up     Final Clinical  Impression(s) / ED Diagnoses Final diagnoses:  Mild intermittent asthma with exacerbation     @PCDICTATION @    Glendora Score, MD 11/18/22 1050

## 2022-11-18 NOTE — ED Triage Notes (Signed)
Pt c/o sob since June 29th and has been seen here and at urgent care in the last 2 days  Pt state she came here last night but could not wait so he left  Pt was seen at urgent care this am and given medications but states he can not breath and his arms are going numb  Pt's mother states "we are not leaving here until something is done"  Pt is in no acute distress and ambulated to room without difficulty

## 2022-11-19 ENCOUNTER — Encounter: Payer: Self-pay | Admitting: Internal Medicine

## 2022-11-21 NOTE — ED Provider Notes (Signed)
[facility]    CSN: [id number] Arrival date & time: 11/18/22  0816      History   Chief Complaint Chief Complaint  Patient presents with   Follow-up    HPI Roy Rowe is a 27 y.o. male.   Patient presenting today with 1 to 1-1/2 weeks of shortness of breath, mild weakness.  He went to the emergency department yesterday but did not wait to be seen.  In triage, chest x-ray was performed which she viewed on his MyChart and is concerned because of a possible "collapsed lung".  He does have a history of asthma as well as sarcoidosis and states has been using his rescue inhaler 3-4 times a day with only mild temporary relief.  Denies fever, chills, cough, chest pain, palpitations, recent illness.  He is on Humira and methotrexate for sarcoid.  States he just recently got off of high-dose steroids daily for the past few years.    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 Authorizing Provider  budesonide-formoterol (SYMBICORT) 160-4.5 MCG/ACT inhaler Inhale 2 puffs into the  lungs 2 (two) times daily. Rinse mouth with water after each use 11/18/22  Yes Particia Nearing, PA-C  Adalimumab 40 MG/0.4ML PNKT Inject 40 mg into the skin every 14 (fourteen) days. 12/03/21   [provider]  albuterol (VENTOLIN HFA) 108 (90 Base) MCG/ACT inhaler Inhale 1-2 puffs into the lungs every 6 (six) hours as needed for wheezing or shortness of breath.    [provider]  azaTHIOprine (IMURAN) 50 MG tablet Take 50 mg by mouth daily. 09/06/20   [provider]  Calcium-Magnesium-Zinc [phone] MG TABS Take 1 tablet by mouth daily.    [provider]  cetirizine (ZYRTEC) 10 MG tablet Take 1 tablet (10 mg total) by mouth daily. 02/03/22   Leath-Warren, Sadie Haber, NP  COD LIVER OIL PO Take 1 capsule by mouth daily.    [provider]  dorzolamide-timolol (COSOPT) 22.3-6.8 MG/ML ophthalmic solution 1 drop 2 (two) times daily. Patient not taking: Reported on 12/13/2021    [provider]  fluticasone (FLONASE) 50 MCG/ACT nasal spray Place 2 sprays into both nostrils daily. 02/03/22   Leath-Warren, Sadie Haber, NP  folic acid (FOLVITE) 1 MG tablet Take 1 tablet by mouth daily. 12/03/21   [provider]  methotrexate (RHEUMATREX) 2.5 MG tablet Take 2.5 mg by mouth once a week. 12/03/21   [provider]  milk thistle 175 MG tablet Take 175 mg  by mouth daily. Patient not taking: Reported on 12/13/2021    [provider]  montelukast (SINGULAIR) 10 MG tablet Take 1 tablet (10 mg total) by mouth at bedtime. 02/03/22   Leath-Warren, Sadie Haber, NP  nystatin (MYCOSTATIN/NYSTOP) powder Apply 1 application topically 3 (three) times daily. Patient not taking: Reported on 12/13/2021 03/04/21   Geoffery Lyons, MD  ondansetron (ZOFRAN-ODT) 4 MG disintegrating tablet Take 1 tablet (4 mg total) by mouth every 8 (eight) hours as needed for nausea or vomiting. 12/13/21   Valentino Nose, NP  Potassium Chloride ER 20 MEQ TBCR Take 20 mEq  by mouth daily. 1 tab daily by mouth Patient not taking: Reported on 11/15/2020 11/07/20   Shon Hale, MD  predniSONE (DELTASONE) 10 MG tablet Take 30 mg by mouth daily. 10/27/20   [provider]  promethazine-dextromethorphan (PROMETHAZINE-DM) 6.25-15 MG/5ML syrup Take 5 mLs by mouth 4 (four) times daily as needed for cough. 02/03/22   Leath-Warren, Sadie Haber, NP  sodium chloride (OCEAN) 0.65 % SOLN nasal spray Place 1 spray into both nostrils as needed for congestion. Patient not taking: Reported on 12/13/2021 11/15/20   Wurst, [place], PA-C  tobramycin (TOBREX) 0.3 % ophthalmic solution Place 1 drop into the right eye every 4 (four) hours. Patient not taking: Reported on 12/13/2021 06/15/20   Moshe Cipro, NP  verapamil (CALAN) 120 MG tablet Take 1 tablet (120 mg total) by mouth 3 (three) times daily. 02/09/21   Bing Neighbors, NP    Family History Family History  Problem Relation Age of Onset   Allergic rhinitis Brother    Asthma Brother     Social History Social History   Tobacco Use   Smoking status: Never    Passive exposure: Yes   Smokeless tobacco: Never  Vaping Use   Vaping status: Never Used  Substance Use Topics   Alcohol use: Not Currently    Comment: rarely   Drug use: No     Allergies   Pistachio nut (diagnostic) and Grape (artificial) flavor   Review of Systems Review of Systems Per HPI  Physical Exam Triage Vital Signs ED Triage Vitals  Encounter Vitals Group     BP 11/18/22 0825 (!) 144/85     Systolic BP Percentile --      Diastolic BP Percentile --      Pulse Rate 11/18/22 0825 77     Resp 11/18/22 0825 16     Temp 11/18/22 0825 98 F (36.7 C)     Temp Source 11/18/22 0825 Oral     SpO2 11/18/22 0825 95 %     Weight --      Height --      Head Circumference --      Peak Flow --      Pain Score 11/18/22 0826 0     Pain Loc --      Pain Education --      Exclude from Growth Chart --    No data found.  Updated Vital  Signs BP (!) 144/85 (BP Location: Right Wrist)   Pulse 77   Temp 98 F (36.7 C) (Oral)   Resp 16   SpO2 95%   Visual Acuity Right Eye Distance:   Left Eye Distance:   Bilateral Distance:    Right Eye Near:   Left Eye Near:    Bilateral Near:     Physical Exam Vitals and nursing note reviewed.  Constitutional:      Appearance: Normal appearance.  He is obese.  HENT:     Head: Atraumatic.     Nose: Nose normal.     Mouth/Throat:     Mouth: Mucous membranes are moist.  Eyes:     Extraocular Movements: Extraocular movements intact.     Conjunctiva/sclera: Conjunctivae normal.  Cardiovascular:     Rate and Rhythm: Normal rate and regular rhythm.  Pulmonary:     Effort: Pulmonary effort is normal. No respiratory distress.     Breath sounds: Wheezing present. No rales.     Comments: Trace wheezes at bases Musculoskeletal:        General: Normal range of motion.     Cervical back: Normal range of motion and neck supple.  Skin:    General: Skin is warm and dry.  Neurological:     General: No focal deficit present.     Mental Status: He is oriented to person, place, and time.  Psychiatric:        Mood and Affect: Mood normal.        Thought Content: Thought content normal.        Judgment: Judgment normal.      UC Treatments / Results  Labs (all labs ordered are listed, but only abnormal results are displayed) Labs Reviewed - No data to display  EKG   Radiology No results found.  Procedures Procedures (including critical care time)  Medications Ordered in UC Medications - No data to display  Initial Impression / Assessment and Plan / UC Course  I have reviewed the triage vital signs and the nursing notes.  Pertinent labs & imaging results that were available during my care of the patient were reviewed by me and considered in my medical decision making (see chart for details).     Very well-appearing today with no acute distress and oxygen saturation on  room air 95%.  Reassurance given regarding the atelectasis noted on yesterday's chest x-ray in the emergency department.  Discussed that this is likely secondary to asthma exacerbation and obesity and strongly recommend use of an incentive spirometer several times daily to improve this.  Do feel he would benefit from some form of steroid at this time to help his asthma regain control, he declines any oral or injectable steroids today as he just recently came off of a very long-term stent of steroid use but is agreeable to a steroid inhaler.  Will add Symbicort and albuterol as needed and follow-up with primary care.  Follow-up sooner for worsening symptoms.  Final Clinical Impressions(s) / UC Diagnoses   Final diagnoses:  Moderate persistent asthma with acute exacerbation  Atelectasis     Discharge Instructions      Your x-ray from the emergency department triage yesterday shows some mild atelectasis at the bases.  This is likely from not taking deep enough breaths to inflate and deflate the lungs adequately.  I highly recommend getting an incentive spirometer and using this 2-3 times per day in addition to taking walks and practicing deep breaths throughout the day to help repressurize the air sacs that are collapsed.  This should resolve the issue.  We will also start a steroid inhaler for suspected asthma exacerbation causing your increased shortness of breath at this moment.  Your oxygen looks great today and your x-ray yesterday shows no evidence of pneumonia or other infectious cause of your symptoms.  As always, if you significantly worsen at any time go to the emergency department for further evaluation.  Follow-up with your  primary care provider within the next week for a recheck of your symptoms.    ED Prescriptions     Medication Sig Dispense Auth. Provider   budesonide-formoterol (SYMBICORT) 160-4.5 MCG/ACT inhaler Inhale 2 puffs into the lungs 2 (two) times daily. Rinse mouth with  water after each use 1 each Particia Nearing, PA-C      PDMP not reviewed this encounter.   Particia Nearing, [place] 11/21/22 1327

## 2023-10-06 ENCOUNTER — Ambulatory Visit
Admission: EM | Admit: 2023-10-06 | Discharge: 2023-10-06 | Disposition: A | Discharge status: Home / Self Care | Payer: Self-pay

## 2023-10-06 ENCOUNTER — Other Ambulatory Visit: Payer: Self-pay

## 2023-10-06 ENCOUNTER — Encounter: Payer: Self-pay | Admitting: Emergency Medicine

## 2023-10-06 DIAGNOSIS — R0981 Nasal congestion: Secondary | ICD-10-CM

## 2023-10-06 DIAGNOSIS — R030 Elevated blood-pressure reading, without diagnosis of hypertension: Secondary | ICD-10-CM

## 2023-10-06 DIAGNOSIS — J4521 Mild intermittent asthma with (acute) exacerbation: Secondary | ICD-10-CM

## 2023-10-06 MED ORDER — AZELASTINE HCL 0.1 % NA SOLN: 1.0000 | Freq: Two times a day (BID) | NASAL | 0 refills | Status: AC

## 2023-10-06 MED ORDER — VERAPAMIL HCL 120 MG PO TABS: 120.0000 mg | ORAL_TABLET | Freq: Three times a day (TID) | ORAL | 0 refills | Status: AC

## 2023-10-06 MED ORDER — ALBUTEROL SULFATE HFA 108 (90 BASE) MCG/ACT IN AERS: 2.0000 | INHALATION_SPRAY | RESPIRATORY_TRACT | 0 refills | Status: AC | PRN

## 2023-10-06 MED ORDER — CETIRIZINE HCL 10 MG PO TABS: 10.0000 mg | ORAL_TABLET | Freq: Every day | ORAL | 2 refills | Status: AC

## 2023-10-06 NOTE — ED Triage Notes (Addendum)
 Pt reports nasal congestion x2 days and reports has been out of rescue inhaler for last several weeks, also ran out of bp medication yesterday.    Reports does not have pcp. Reviewed how to sign up for appointment, find a doctor, etc.

## 2023-10-06 NOTE — ED Provider Notes (Signed)
 [facility]    CSN: [id number] Arrival date & time: 10/06/23  1355      History   Chief Complaint Chief Complaint  Patient presents with   Nasal Congestion    HPI Roy Rowe is a 28 y.o. male.   Patient presenting today with 2-day history of nasal congestion, chest tightness, wheezing.  Denies fever, chills, cough, chest pain, shortness of breath, abdominal pain, vomiting, diarrhea.  So far not trying anything over-the-counter for symptoms.  History of seasonal allergies and asthma, requesting refill on his albuterol  inhaler.  Not currently on allergy regimen.  He is also requesting a refill on his verapamil .  Does not currently have a primary care provider.  Ran out of his medication yesterday.  Does not check his home blood pressures.  Tolerating the medication well.    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 Authorizing Provider  azelastine (ASTELIN) 0.1 % nasal spray Place 1 spray into both nostrils 2 (two) times daily. Use in each nostril as directed 10/06/23   Yes Corbin Dess, PA-C  cetirizine  (ZYRTEC  ALLERGY) 10 MG tablet Take 1 tablet (10 mg total) by mouth daily. 10/06/23  Yes Corbin Dess, PA-C  [facility] [place] Inject into the skin.   Yes [provider]  Adalimumab 40 MG/0.4ML PNKT Inject 40 mg into the skin every 14 (fourteen) days. 12/03/21   [provider]  albuterol  (VENTOLIN  HFA) 108 (90 Base) MCG/ACT inhaler Inhale 2 puffs into the lungs every 4 (four) hours as needed for wheezing or shortness of breath. 10/06/23   Corbin Dess, PA-C  azaTHIOprine (IMURAN) 50 MG tablet Take 50 mg by mouth daily. Patient not taking: Reported on 10/06/2023 09/06/20   [provider]  budesonide -formoterol  (SYMBICORT ) 160-4.5 MCG/ACT inhaler Inhale 2 puffs into the lungs 2 (two) times daily. Rinse mouth with water after each use Patient not taking: Reported on 10/06/2023 11/18/22   Corbin Dess, PA-C  folic acid (FOLVITE) 1 MG tablet Take 1 tablet by mouth daily. 12/03/21   [provider]  methotrexate (RHEUMATREX) 2.5 MG tablet Take 2.5 mg by mouth once a week. 12/03/21   [provider]  milk thistle 175 MG tablet Take 175 mg by mouth daily. Patient not taking: Reported on 12/13/2021    [provider]  verapamil  (CALAN ) 120 MG tablet Take 1 tablet (120 mg total) by mouth 3 (three) times daily. 10/06/23   Corbin Dess, PA-C  Family History Family History  Problem Relation Age of Onset   Allergic rhinitis Brother    Asthma Brother     Social History Social History   Tobacco Use   Smoking status: Never    Passive exposure: Yes   Smokeless tobacco: Never  Vaping Use   Vaping status: Never Used  Substance Use Topics   Alcohol use: Not Currently    Comment: rarely   Drug use: No     Allergies   Pistachio nut (diagnostic), Grape (artificial) flavoring agent (non-screening), and Prednisone   Review of Systems Review of Systems Per  HPI  Physical Exam Triage Vital Signs ED Triage Vitals  Encounter Vitals Group     BP 10/06/23 1434 (!) 149/95     Systolic BP Percentile --      Diastolic BP Percentile --      Pulse Rate 10/06/23 1434 (!) 101     Resp 10/06/23 1434 (!) 22     Temp 10/06/23 1434 98.3 F (36.8 C)     Temp Source 10/06/23 1434 Oral     SpO2 10/06/23 1434 95 %     Weight --      Height --      Head Circumference --      Peak Flow --      Pain Score 10/06/23 1435 0     Pain Loc --      Pain Education --      Exclude from Growth Chart --    No data found.  Updated Vital Signs BP (!) 149/95 (BP Location: Right Arm)   Pulse (!) 101   Temp 98.3 F (36.8 C) (Oral)   Resp (!) 22   SpO2 95%   Visual Acuity Right Eye Distance:   Left Eye Distance:   Bilateral Distance:    Right Eye Near:   Left Eye Near:    Bilateral Near:     Physical Exam Vitals and nursing note reviewed.  Constitutional:      Appearance: He is well-developed.  HENT:     Head: Atraumatic.     Right Ear: External ear normal.     Left Ear: External ear normal.     Nose: Rhinorrhea present.     Mouth/Throat:     Pharynx: No oropharyngeal exudate or posterior oropharyngeal erythema.  Eyes:     Conjunctiva/sclera: Conjunctivae normal.     Pupils: Pupils are equal, round, and reactive to light.  Cardiovascular:     Rate and Rhythm: Normal rate and regular rhythm.  Pulmonary:     Effort: Pulmonary effort is normal. No respiratory distress.     Breath sounds: Wheezing present. No rales.  Musculoskeletal:        General: Normal range of motion.     Cervical back: Normal range of motion and neck supple.  Lymphadenopathy:     Cervical: No cervical adenopathy.  Skin:    General: Skin is warm and dry.  Neurological:     Mental Status: He is alert and oriented to person, place, and time.  Psychiatric:        Behavior: Behavior normal.      UC Treatments / Results  Labs (all labs ordered are listed, but only  abnormal results are displayed) Labs Reviewed - No data to display  EKG   Radiology No results found.  Procedures Procedures (including critical care time)  Medications Ordered in UC Medications - No data to display  Initial Impression / Assessment and  Plan / UC Course  I have reviewed the triage vital signs and the nursing notes.  Pertinent labs & imaging results that were available during my care of the patient were reviewed by me and considered in my medical decision making (see chart for details).     Will start back on allergy regimen of Zyrtec  and Astelin, refill albuterol  inhaler and verapamil .  Discussed importance of finding a primary care provider, monitoring home blood pressures and follow-up for worsening or unresolving symptoms. Final Clinical Impressions(s) / UC Diagnoses   Final diagnoses:  Nasal congestion  Mild intermittent asthma with acute exacerbation  Elevated blood pressure reading   Discharge Instructions   None    ED Prescriptions     Medication Sig Dispense Auth. Provider   albuterol  (VENTOLIN  HFA) 108 (90 Base) MCG/ACT inhaler Inhale 2 puffs into the lungs every 4 (four) hours as needed for wheezing or shortness of breath. 18 g Corbin Dess, PA-C   verapamil  (CALAN ) 120 MG tablet Take 1 tablet (120 mg total) by mouth 3 (three) times daily. 90 tablet Tacy Expose Cromwell, PA-C   cetirizine  (ZYRTEC  ALLERGY) 10 MG tablet Take 1 tablet (10 mg total) by mouth daily. 30 tablet Corbin Dess, PA-C   azelastine (ASTELIN) 0.1 % nasal spray Place 1 spray into both nostrils 2 (two) times daily. Use in each nostril as directed 30 mL Corbin Dess, PA-C      PDMP not reviewed this encounter.   Corbin Dess, [place] 10/06/23 1620

## 2023-10-19 ENCOUNTER — Other Ambulatory Visit: Payer: Self-pay | Admitting: Family Medicine
